# Patient Record
Sex: Female | Born: 1960 | Race: Black or African American | Hispanic: No | Marital: Married | Smoking: Former smoker
Health system: Southern US, Community
[De-identification: ages and names within clinical notes are randomized; demographics above are authoritative.]

## PROBLEM LIST (undated history)

## (undated) DIAGNOSIS — E7800 Pure hypercholesterolemia, unspecified: Secondary | ICD-10-CM

## (undated) DIAGNOSIS — E119 Type 2 diabetes mellitus without complications: Secondary | ICD-10-CM

## (undated) DIAGNOSIS — F419 Anxiety disorder, unspecified: Secondary | ICD-10-CM

## (undated) DIAGNOSIS — M549 Dorsalgia, unspecified: Secondary | ICD-10-CM

## (undated) DIAGNOSIS — D573 Sickle-cell trait: Secondary | ICD-10-CM

## (undated) DIAGNOSIS — G4730 Sleep apnea, unspecified: Secondary | ICD-10-CM

## (undated) DIAGNOSIS — F329 Major depressive disorder, single episode, unspecified: Secondary | ICD-10-CM

## (undated) DIAGNOSIS — D649 Anemia, unspecified: Secondary | ICD-10-CM

## (undated) DIAGNOSIS — F32A Depression, unspecified: Secondary | ICD-10-CM

## (undated) HISTORY — DX: Anemia, unspecified: D64.9

## (undated) HISTORY — PX: TUBAL LIGATION: SHX77

## (undated) HISTORY — DX: Anxiety disorder, unspecified: F41.9

## (undated) HISTORY — DX: Pure hypercholesterolemia, unspecified: E78.00

## (undated) HISTORY — DX: Sleep apnea, unspecified: G47.30

---

## 2000-08-13 ENCOUNTER — Emergency Department (HOSPITAL_COMMUNITY): Admission: EM | Admit: 2000-08-13 | Discharge: 2000-08-13 | Payer: Self-pay | Admitting: Emergency Medicine

## 2001-08-20 ENCOUNTER — Emergency Department (HOSPITAL_COMMUNITY): Admission: EM | Admit: 2001-08-20 | Discharge: 2001-08-20 | Payer: Self-pay | Admitting: *Deleted

## 2001-11-10 ENCOUNTER — Other Ambulatory Visit: Admission: RE | Admit: 2001-11-10 | Discharge: 2001-11-10 | Payer: Self-pay | Admitting: Family Medicine

## 2002-07-28 ENCOUNTER — Emergency Department (HOSPITAL_COMMUNITY): Admission: EM | Admit: 2002-07-28 | Discharge: 2002-07-28 | Payer: Self-pay | Admitting: Emergency Medicine

## 2002-10-25 ENCOUNTER — Encounter: Payer: Self-pay | Admitting: Emergency Medicine

## 2002-10-25 ENCOUNTER — Emergency Department (HOSPITAL_COMMUNITY): Admission: EM | Admit: 2002-10-25 | Discharge: 2002-10-25 | Payer: Self-pay | Admitting: Emergency Medicine

## 2002-12-24 ENCOUNTER — Emergency Department (HOSPITAL_COMMUNITY): Admission: EM | Admit: 2002-12-24 | Discharge: 2002-12-24 | Payer: Self-pay | Admitting: Emergency Medicine

## 2004-06-05 ENCOUNTER — Emergency Department (HOSPITAL_COMMUNITY): Admission: EM | Admit: 2004-06-05 | Discharge: 2004-06-05 | Payer: Self-pay | Admitting: Emergency Medicine

## 2004-11-01 ENCOUNTER — Emergency Department (HOSPITAL_COMMUNITY): Admission: EM | Admit: 2004-11-01 | Discharge: 2004-11-02 | Payer: Self-pay | Admitting: Emergency Medicine

## 2005-10-22 ENCOUNTER — Emergency Department (HOSPITAL_COMMUNITY): Admission: EM | Admit: 2005-10-22 | Discharge: 2005-10-22 | Payer: Self-pay | Admitting: Emergency Medicine

## 2006-01-10 ENCOUNTER — Inpatient Hospital Stay (HOSPITAL_COMMUNITY): Admission: AD | Admit: 2006-01-10 | Discharge: 2006-01-10 | Payer: Self-pay | Admitting: Obstetrics and Gynecology

## 2006-04-17 ENCOUNTER — Emergency Department (HOSPITAL_COMMUNITY): Admission: EM | Admit: 2006-04-17 | Discharge: 2006-04-17 | Payer: Self-pay | Admitting: Emergency Medicine

## 2006-04-20 ENCOUNTER — Emergency Department (HOSPITAL_COMMUNITY): Admission: EM | Admit: 2006-04-20 | Discharge: 2006-04-20 | Payer: Self-pay | Admitting: Emergency Medicine

## 2006-12-15 ENCOUNTER — Emergency Department (HOSPITAL_COMMUNITY): Admission: EM | Admit: 2006-12-15 | Discharge: 2006-12-15 | Payer: Self-pay | Admitting: Emergency Medicine

## 2007-12-19 ENCOUNTER — Emergency Department (HOSPITAL_COMMUNITY): Admission: EM | Admit: 2007-12-19 | Discharge: 2007-12-19 | Payer: Self-pay | Admitting: Emergency Medicine

## 2008-05-14 ENCOUNTER — Emergency Department (HOSPITAL_COMMUNITY): Admission: EM | Admit: 2008-05-14 | Discharge: 2008-05-14 | Payer: Self-pay | Admitting: Emergency Medicine

## 2008-06-29 ENCOUNTER — Emergency Department (HOSPITAL_COMMUNITY): Admission: EM | Admit: 2008-06-29 | Discharge: 2008-06-29 | Payer: Self-pay | Admitting: Emergency Medicine

## 2009-05-12 ENCOUNTER — Emergency Department (HOSPITAL_COMMUNITY): Admission: EM | Admit: 2009-05-12 | Discharge: 2009-05-12 | Payer: Self-pay | Admitting: Emergency Medicine

## 2009-08-05 ENCOUNTER — Emergency Department (HOSPITAL_COMMUNITY)
Admission: EM | Admit: 2009-08-05 | Discharge: 2009-08-06 | Payer: Self-pay | Source: Home / Self Care | Admitting: Emergency Medicine

## 2010-02-16 ENCOUNTER — Emergency Department (HOSPITAL_COMMUNITY)
Admission: EM | Admit: 2010-02-16 | Discharge: 2010-02-16 | Disposition: A | Discharge status: Home / Self Care | Payer: Self-pay | Attending: Emergency Medicine | Admitting: Emergency Medicine

## 2010-02-16 DIAGNOSIS — M545 Low back pain, unspecified: Secondary | ICD-10-CM | POA: Insufficient documentation

## 2010-02-16 DIAGNOSIS — IMO0001 Reserved for inherently not codable concepts without codable children: Secondary | ICD-10-CM | POA: Insufficient documentation

## 2010-04-22 LAB — URINALYSIS, ROUTINE W REFLEX MICROSCOPIC
Glucose, UA: NEGATIVE mg/dL
Leukocytes, UA: NEGATIVE
Protein, ur: NEGATIVE mg/dL
Specific Gravity, Urine: 1.016 (ref 1.005–1.030)
pH: 5 (ref 5.0–8.0)

## 2010-04-22 LAB — BASIC METABOLIC PANEL
BUN: 11 mg/dL (ref 6–23)
Chloride: 106 mEq/L (ref 96–112)
GFR calc non Af Amer: >60 mL/min (ref >60)
Glucose, Bld: 88 mg/dL (ref 70–99)
Potassium: 3.6 mEq/L (ref 3.5–5.1)
Sodium: 140 mEq/L (ref 135–145)

## 2010-04-22 LAB — CBC
HCT: 40.9 % (ref 36.0–46.0)
Hemoglobin: 14.2 g/dL (ref 12.0–15.0)
MCV: 90.8 fL (ref 78.0–100.0)
Platelets: 247 10*3/uL (ref 150–400)
WBC: 6 10*3/uL (ref 4.0–10.5)

## 2010-04-22 LAB — DIFFERENTIAL
Eosinophils Absolute: 0.1 10*3/uL (ref 0.0–0.7)
Eosinophils Relative: 2 % (ref 0–5)
Lymphocytes Relative: 22 % (ref 12–46)
Lymphs Abs: 1.3 10*3/uL (ref 0.7–4.0)
Monocytes Absolute: 0.4 10*3/uL (ref 0.1–1.0)
Monocytes Relative: 7 % (ref 3–12)

## 2010-04-22 LAB — URINE MICROSCOPIC-ADD ON

## 2010-04-22 LAB — POCT CARDIAC MARKERS: Troponin i, poc: <0.05 ng/mL (ref 0.00–0.09)

## 2010-04-22 LAB — POCT PREGNANCY, URINE: Preg Test, Ur: NEGATIVE

## 2010-04-23 LAB — POCT CARDIAC MARKERS
CKMB, poc: <1 ng/mL — ABNORMAL LOW (ref 1.0–8.0)
Troponin i, poc: <0.05 ng/mL (ref 0.00–0.09)

## 2010-08-17 ENCOUNTER — Emergency Department (HOSPITAL_COMMUNITY)
Admission: EM | Admit: 2010-08-17 | Discharge: 2010-08-17 | Disposition: A | Discharge status: Home / Self Care | Payer: Self-pay | Attending: Emergency Medicine | Admitting: Emergency Medicine

## 2010-08-17 ENCOUNTER — Emergency Department (HOSPITAL_COMMUNITY): Payer: Self-pay

## 2010-08-17 DIAGNOSIS — M549 Dorsalgia, unspecified: Secondary | ICD-10-CM | POA: Insufficient documentation

## 2010-08-17 DIAGNOSIS — M79609 Pain in unspecified limb: Secondary | ICD-10-CM | POA: Insufficient documentation

## 2010-08-23 ENCOUNTER — Emergency Department (HOSPITAL_COMMUNITY)
Admission: EM | Admit: 2010-08-23 | Discharge: 2010-08-23 | Disposition: A | Discharge status: Home / Self Care | Payer: Self-pay | Attending: Emergency Medicine | Admitting: Emergency Medicine

## 2010-08-23 DIAGNOSIS — M67919 Unspecified disorder of synovium and tendon, unspecified shoulder: Secondary | ICD-10-CM | POA: Insufficient documentation

## 2010-08-23 DIAGNOSIS — M436 Torticollis: Secondary | ICD-10-CM | POA: Insufficient documentation

## 2010-08-23 DIAGNOSIS — M542 Cervicalgia: Secondary | ICD-10-CM | POA: Insufficient documentation

## 2010-08-23 DIAGNOSIS — M25519 Pain in unspecified shoulder: Secondary | ICD-10-CM | POA: Insufficient documentation

## 2010-08-23 DIAGNOSIS — M719 Bursopathy, unspecified: Secondary | ICD-10-CM | POA: Insufficient documentation

## 2010-09-02 ENCOUNTER — Emergency Department (HOSPITAL_COMMUNITY)
Admission: EM | Admit: 2010-09-02 | Discharge: 2010-09-02 | Disposition: A | Discharge status: Home / Self Care | Payer: Self-pay | Attending: Emergency Medicine | Admitting: Emergency Medicine

## 2010-09-02 DIAGNOSIS — M79609 Pain in unspecified limb: Secondary | ICD-10-CM | POA: Insufficient documentation

## 2010-09-02 DIAGNOSIS — M25519 Pain in unspecified shoulder: Secondary | ICD-10-CM | POA: Insufficient documentation

## 2010-10-18 LAB — URINE MICROSCOPIC-ADD ON

## 2010-10-18 LAB — URINALYSIS, ROUTINE W REFLEX MICROSCOPIC
Bilirubin Urine: NEGATIVE
Nitrite: NEGATIVE
Specific Gravity, Urine: 1.016 (ref 1.005–1.030)
Urobilinogen, UA: 0.2 mg/dL (ref 0.0–1.0)

## 2010-10-29 ENCOUNTER — Other Ambulatory Visit: Payer: Self-pay | Admitting: Internal Medicine

## 2010-10-29 ENCOUNTER — Other Ambulatory Visit: Payer: Self-pay | Admitting: Family Medicine

## 2010-10-29 DIAGNOSIS — Z1231 Encounter for screening mammogram for malignant neoplasm of breast: Secondary | ICD-10-CM

## 2010-11-14 ENCOUNTER — Ambulatory Visit
Admission: RE | Admit: 2010-11-14 | Discharge: 2010-11-14 | Disposition: A | Discharge status: Home / Self Care | Payer: Medicaid Other | Source: Ambulatory Visit | Attending: Internal Medicine | Admitting: Internal Medicine

## 2010-11-14 DIAGNOSIS — Z1231 Encounter for screening mammogram for malignant neoplasm of breast: Secondary | ICD-10-CM

## 2011-06-22 ENCOUNTER — Encounter (HOSPITAL_COMMUNITY): Payer: Self-pay

## 2011-06-22 ENCOUNTER — Emergency Department (HOSPITAL_COMMUNITY)
Admission: EM | Admit: 2011-06-22 | Discharge: 2011-06-22 | Disposition: A | Discharge status: Home / Self Care | Payer: Medicaid Other | Attending: Emergency Medicine | Admitting: Emergency Medicine

## 2011-06-22 DIAGNOSIS — Z87891 Personal history of nicotine dependence: Secondary | ICD-10-CM | POA: Insufficient documentation

## 2011-06-22 DIAGNOSIS — IMO0002 Reserved for concepts with insufficient information to code with codable children: Secondary | ICD-10-CM | POA: Insufficient documentation

## 2011-06-22 DIAGNOSIS — M5430 Sciatica, unspecified side: Secondary | ICD-10-CM

## 2011-06-22 HISTORY — DX: Dorsalgia, unspecified: M54.9

## 2011-06-22 MED ORDER — HYDROCODONE-ACETAMINOPHEN 5-325 MG PO TABS: 1.0000 | ORAL_TABLET | Freq: Four times a day (QID) | ORAL | Status: AC | PRN

## 2011-06-22 MED ORDER — KETOROLAC TROMETHAMINE 60 MG/2ML IM SOLN
60.0000 mg | Freq: Once | INTRAMUSCULAR | Status: AC
  Administered 2011-06-22: 60 mg via INTRAMUSCULAR
  Filled 2011-06-22: qty 2

## 2011-06-22 MED ORDER — CYCLOBENZAPRINE HCL 10 MG PO TABS: 10.0000 mg | ORAL_TABLET | Freq: Three times a day (TID) | ORAL | Status: AC | PRN

## 2011-06-22 MED ORDER — PREDNISONE 50 MG PO TABS: 50.0000 mg | ORAL_TABLET | Freq: Every day | ORAL | Status: AC

## 2011-06-22 NOTE — Discharge Instructions (Signed)
Followup with her primary care Dr. for recheck.  Use ice and heat on the lower back.  Return here for any worsening in her condition

## 2011-06-22 NOTE — ED Provider Notes (Signed)
Medical screening examination/treatment/procedure(s) were performed by non-physician practitioner and as supervising physician I was immediately available for consultation/collaboration.   Dominique Calvey, MD 06/22/11 2027 

## 2011-06-22 NOTE — ED Provider Notes (Signed)
History     CSN: [id number]  Arrival date & time 06/22/11  1529   First MD Initiated Contact with Patient 06/22/11 1729      Chief Complaint  Patient presents with  . Back Pain  . leg cramping     (Consider location/radiation/quality/duration/timing/severity/associated sxs/prior treatment) HPI The patient presents to the emergency department with back pain that began yesterday.  The patient has had chronic low back pain in the past.  She states that the pain radiates from her left lower back into her left leg.  Patient, states she tried over-the-counter medications without relief.  Patient, states that she has not had any numbness, weakness, fever, nausea, vomiting, abdominal pain, chest pain, shortness of breath, or dizziness. Past Medical History  Diagnosis Date  . Back pain     History reviewed. No pertinent past surgical history.  No family history on file.  History  Substance Use Topics  . Smoking status: Former Games developer  . Smokeless tobacco: Not on file  . Alcohol Use: No    OB History    Grav Para Term Preterm Abortions TAB SAB Ect Mult Living                  Review of Systems All other systems negative except as documented in the HPI. All pertinent positives and negatives as reviewed in the HPI.  Allergies  Review of patient's allergies indicates no known allergies.  Home Medications   Current Outpatient Rx  Name Route Sig Dispense Refill  . DIPHENHYDRAMINE-APAP (SLEEP) 25-500 MG PO TABS Oral Take 2 tablets by mouth at bedtime as needed. For sleep.      BP 114/73  Pulse 74  Temp(Src) 98.7 F (37.1 C) (Oral)  Resp 18  SpO2 100%  LMP 06/15/2011  Physical Exam  Nursing note and vitals reviewed. Constitutional: She is oriented to person, place, and time. She appears well-developed and well-nourished. She appears distressed.  HENT:  Head: Normocephalic and atraumatic.  Eyes: Pupils are equal, round, and reactive to light.  Cardiovascular: Normal  rate and regular rhythm.  Exam reveals no gallop and no friction rub.   No murmur heard. Pulmonary/Chest: Effort normal and breath sounds normal. No respiratory distress.  Musculoskeletal:       Lumbar back: She exhibits tenderness and pain. She exhibits no bony tenderness, no swelling and no deformity.       Back:  Neurological: She is alert and oriented to person, place, and time. She has normal strength. No sensory deficit. She displays a negative Romberg sign. Coordination and gait normal.  Reflex Scores:      Patellar reflexes are 2+ on the right side and 2+ on the left side.      Achilles reflexes are 2+ on the right side and 2+ on the left side. Skin: Skin is warm and dry. No rash noted.    ED Course  Procedures (including critical care time)  Patient be treated for lumbar radiculopathy based on her history of present illness and physical exam findings.  Patient is advised to follow with her primary care Dr. for recheck.  The patient, states she has been seeing her primary doctor for this problem in the past.  Patient does not have any neurological deficits and normal reflexes on exam, along with normal gait.   MDM          Carlyle Dolly, PA-C 06/22/11 [phone]

## 2011-06-22 NOTE — ED Notes (Signed)
Pt in from home c/o back pain since yesterday non radiating states a hx of chronic back pain states no elief with overt the counter medicines pt also states left leg cramping on and off pt denies recent injury

## 2011-10-10 ENCOUNTER — Encounter (HOSPITAL_COMMUNITY): Payer: Self-pay | Admitting: *Deleted

## 2011-10-10 ENCOUNTER — Inpatient Hospital Stay (HOSPITAL_COMMUNITY)
Admission: AD | Admit: 2011-10-10 | Discharge: 2011-10-10 | Disposition: A | Discharge status: Home / Self Care | Payer: Medicaid Other | Source: Ambulatory Visit | Attending: Obstetrics & Gynecology | Admitting: Obstetrics & Gynecology

## 2011-10-10 DIAGNOSIS — L739 Follicular disorder, unspecified: Secondary | ICD-10-CM

## 2011-10-10 DIAGNOSIS — L738 Other specified follicular disorders: Secondary | ICD-10-CM

## 2011-10-10 DIAGNOSIS — N949 Unspecified condition associated with female genital organs and menstrual cycle: Secondary | ICD-10-CM | POA: Insufficient documentation

## 2011-10-10 HISTORY — DX: Depression, unspecified: F32.A

## 2011-10-10 HISTORY — DX: Sickle-cell trait: D57.3

## 2011-10-10 HISTORY — DX: Major depressive disorder, single episode, unspecified: F32.9

## 2011-10-10 MED ORDER — BACITRACIN 500 UNIT/GM EX OINT: 1.0000 "application " | TOPICAL_OINTMENT | Freq: Two times a day (BID) | CUTANEOUS | Status: DC

## 2011-10-10 NOTE — MAU Note (Signed)
Patient states she had a boil like bump on the left labia that she squeezed. Has gotten bigger and is more painful.

## 2011-10-10 NOTE — MAU Provider Note (Signed)
  History     CSN: [id number]  Arrival date and time: 10/10/11 1553   None     Chief Complaint  Patient presents with  . Vaginal Pain   HPI This is a 51 y.o. female who presents with c/o area of infection on her left labia. Has a history of Bartholins but does not remember it. This area is smaller. Thinks it is hair bumps. Not too painful, but irritated. Not sexually active in past 5 yrs.  OB History    Grav Para Term Preterm Abortions TAB SAB Ect Mult Living   11 5 5  6 6    5       Past Medical History  Diagnosis Date  . Back pain   . Sickle cell trait   . Depression     Past Surgical History  Procedure Date  . Cesarean section   . Tubal ligation     History reviewed. No pertinent family history.  History  Substance Use Topics  . Smoking status: Former Games developer  . Smokeless tobacco: Not on file  . Alcohol Use: Yes     occasional beer    Allergies: No Known Allergies  Prescriptions prior to admission  Medication Sig Dispense Refill  . diphenhydrAMINE (BENADRYL) 25 mg capsule Take 25 mg by mouth every 6 (six) hours as needed. For allergies        ROS See HPI  Physical Exam   Blood pressure 124/85, pulse 76, temperature 98.6 F (37 C), temperature source Oral, resp. rate 16, height 5\' 3"  (1.6 m), weight 209 lb 9.6 oz (95.074 kg), last menstrual period 08/28/2011, SpO2 100.00%.  Physical Exam  Constitutional: She is oriented to person, place, and time. She appears well-developed and well-nourished. No distress.  Cardiovascular: Normal rate.   Respiratory: Effort normal.  GI: Soft.  Genitourinary: Vagina normal and uterus normal. No vaginal discharge found.       There is a small 1cm oval area on left labial edge with several small white bumps.  Little erethema. Overall appearance could represent an HSV lesion, though it is not ulcerated. Could also be folliculitis  Musculoskeletal: Normal range of motion.  Neurological: She is alert and oriented to  person, place, and time.  Skin: Skin is warm and dry.  Psychiatric: She has a normal mood and affect.    MAU Course  Procedures  Assessment and Plan  A:  Folliculitis      Small chance this could represent HSV lesion  P:  Discussed/offered HSV culture, pt declines. Does not think it is HSV      Rx Bactroban ointment       Keep clean, warm soaks, ointment at night.      Followup as needed  [facility] 10/10/2011, 5:16 PM

## 2011-12-20 ENCOUNTER — Encounter (HOSPITAL_COMMUNITY): Payer: Self-pay | Admitting: *Deleted

## 2011-12-20 ENCOUNTER — Emergency Department (HOSPITAL_COMMUNITY)
Admission: EM | Admit: 2011-12-20 | Discharge: 2011-12-20 | Disposition: A | Discharge status: Home / Self Care | Payer: Self-pay | Attending: Emergency Medicine | Admitting: Emergency Medicine

## 2011-12-20 DIAGNOSIS — Z87891 Personal history of nicotine dependence: Secondary | ICD-10-CM | POA: Insufficient documentation

## 2011-12-20 DIAGNOSIS — R600 Localized edema: Secondary | ICD-10-CM

## 2011-12-20 DIAGNOSIS — D573 Sickle-cell trait: Secondary | ICD-10-CM | POA: Insufficient documentation

## 2011-12-20 DIAGNOSIS — A64 Unspecified sexually transmitted disease: Secondary | ICD-10-CM | POA: Insufficient documentation

## 2011-12-20 DIAGNOSIS — R609 Edema, unspecified: Secondary | ICD-10-CM | POA: Insufficient documentation

## 2011-12-20 DIAGNOSIS — A599 Trichomoniasis, unspecified: Secondary | ICD-10-CM | POA: Insufficient documentation

## 2011-12-20 DIAGNOSIS — Z8659 Personal history of other mental and behavioral disorders: Secondary | ICD-10-CM | POA: Insufficient documentation

## 2011-12-20 LAB — URINALYSIS, ROUTINE W REFLEX MICROSCOPIC
Bilirubin Urine: NEGATIVE
Glucose, UA: NEGATIVE mg/dL
Nitrite: NEGATIVE
Specific Gravity, Urine: 1.018 (ref 1.005–1.030)
pH: 5.5 (ref 5.0–8.0)

## 2011-12-20 LAB — BASIC METABOLIC PANEL
BUN: 11 mg/dL (ref 6–23)
Calcium: 8.6 mg/dL (ref 8.4–10.5)
Creatinine, Ser: 0.59 mg/dL (ref 0.50–1.10)
GFR calc Af Amer: >90 mL/min (ref >90)
GFR calc non Af Amer: >90 mL/min (ref >90)

## 2011-12-20 LAB — CBC
MCHC: 34.7 g/dL (ref 30.0–36.0)
RDW: 13 % (ref 11.5–15.5)

## 2011-12-20 LAB — PRO B NATRIURETIC PEPTIDE: Pro B Natriuretic peptide (BNP): 26.6 pg/mL (ref 0–125)

## 2011-12-20 LAB — URINE MICROSCOPIC-ADD ON

## 2011-12-20 MED ORDER — METRONIDAZOLE 500 MG PO TABS
2000.0000 mg | ORAL_TABLET | Freq: Once | ORAL | Status: AC
  Administered 2011-12-20: 2000 mg via ORAL
  Filled 2011-12-20: qty 4

## 2011-12-20 NOTE — ED Provider Notes (Signed)
History     CSN: [id number]  Arrival date & time 12/20/11  1758   First MD Initiated Contact with Patient 12/20/11 2005      Chief Complaint  Patient presents with  . Leg Swelling   HPI  History provided by the patient. Patient is a 51 year old Latin American female with no significant PMH who presents with complaints of bilateral feet and ankle swelling. Patient reports noticing some swelling yesterday but more so today. Symptoms seem to worsen at the end of the evening and day. Patient does report standing most of the day for her job. She denies having any fever ankle swelling previously. Swelling is not painful. She denies any skin change or color change. Denies any injuries or traumas. She denies any chest pain, shortness of breath or heart palpitations. Patient denies any decreased urine output, dysuria, urinary frequency or hematuria. Patient reports some increase on healthy eating recently but no other changes.    Past Medical History  Diagnosis Date  . Back pain   . Sickle cell trait   . Depression     Past Surgical History  Procedure Date  . Cesarean section   . Tubal ligation     No family history on file.  History  Substance Use Topics  . Smoking status: Former Games developer  . Smokeless tobacco: Not on file  . Alcohol Use: Yes     Comment: occasional beer    OB History    Grav Para Term Preterm Abortions TAB SAB Ect Mult Living   11 5 5  6 6    5       Review of Systems  Constitutional: Negative for fever, chills, diaphoresis and appetite change.  Respiratory: Negative for shortness of breath.   Cardiovascular: Positive for leg swelling. Negative for chest pain and palpitations.  Gastrointestinal: Negative for nausea, vomiting, abdominal pain and diarrhea.  Genitourinary: Negative for dysuria, frequency, hematuria, flank pain, decreased urine volume, vaginal bleeding and vaginal discharge.  Neurological: Negative for weakness and numbness.  All other systems  reviewed and are negative.    Allergies  Review of patient's allergies indicates no known allergies.  Home Medications   Current Outpatient Rx  Name  Route  Sig  Dispense  Refill  . IBUPROFEN 200 MG PO TABS   Oral   Take 200 mg by mouth every 6 (six) hours as needed. Pain           BP 109/69  Pulse 74  Temp 97.8 F (36.6 C) (Oral)  Resp 16  Ht 5\' 4"  (1.626 m)  Wt 200 lb (90.719 kg)  BMI 34.33 kg/m2  SpO2 99%  LMP 12/16/2011  Physical Exam  Nursing note and vitals reviewed. Constitutional: She is oriented to person, place, and time. She appears well-developed and well-nourished. No distress.  HENT:  Head: Normocephalic.  Cardiovascular: Normal rate and regular rhythm.   No murmur heard. Pulmonary/Chest: Effort normal and breath sounds normal. No respiratory distress. She has no wheezes. She has no rales.  Abdominal: Soft. There is no tenderness. There is no rebound and no guarding.       Obese  Musculoskeletal: Normal range of motion. She exhibits edema. She exhibits no tenderness.       Mild bilateral lower feet and ankle swelling. No significant pitting. Normal dorsal pedal pulses. Normal movements. Normal sensation and cap refill in toes.  Neurological: She is alert and oriented to person, place, and time.  Skin: Skin is warm and dry. No  rash noted. No erythema.  Psychiatric: She has a normal mood and affect. Her behavior is normal.    ED Course  Procedures   Results for orders placed during the hospital encounter of 12/20/11  PRO B NATRIURETIC PEPTIDE      Component Value Range   Pro B Natriuretic peptide (BNP) 26.6  0 - 125 pg/mL  URINALYSIS, ROUTINE W REFLEX MICROSCOPIC      Component Value Range   Color, Urine YELLOW  YELLOW   APPearance CLOUDY (*) CLEAR   Specific Gravity, Urine 1.018  1.005 - 1.030   pH 5.5  5.0 - 8.0   Glucose, UA NEGATIVE  NEGATIVE mg/dL   Hgb urine dipstick LARGE (*) NEGATIVE   Bilirubin Urine NEGATIVE  NEGATIVE   Ketones, ur  NEGATIVE  NEGATIVE mg/dL   Protein, ur NEGATIVE  NEGATIVE mg/dL   Urobilinogen, UA 0.2  0.0 - 1.0 mg/dL   Nitrite NEGATIVE  NEGATIVE   Leukocytes, UA SMALL (*) NEGATIVE  URINE MICROSCOPIC-ADD ON      Component Value Range   Squamous Epithelial / LPF FEW (*) RARE   WBC, UA 3-6  <3 WBC/hpf   RBC / HPF 3-6  <3 RBC/hpf   Bacteria, UA FEW (*) RARE   Crystals URIC ACID CRYSTALS (*) NEGATIVE   Urine-Other TRICHOMONAS PRESENT    BASIC METABOLIC PANEL      Component Value Range   Sodium 142  135 - 145 mEq/L   Potassium 3.5  3.5 - 5.1 mEq/L   Chloride 106  96 - 112 mEq/L   CO2 23  19 - 32 mEq/L   Glucose, Bld 133 (*) 70 - 99 mg/dL   BUN 11  6 - 23 mg/dL   Creatinine, Ser 1.61  0.50 - 1.10 mg/dL   Calcium 8.6  8.4 - 09.6 mg/dL   GFR calc non Af Amer >90  >90 mL/min   GFR calc Af Amer >90  >90 mL/min  CBC      Component Value Range   WBC 4.4  4.0 - 10.5 K/uL   RBC 3.92  3.87 - 5.11 MIL/uL   Hemoglobin 11.6 (*) 12.0 - 15.0 g/dL   HCT 04.5 (*) 40.9 - 81.1 %   MCV 85.2  78.0 - 100.0 fL   MCH 29.6  26.0 - 34.0 pg   MCHC 34.7  30.0 - 36.0 g/dL   RDW 91.4  78.2 - 95.6 %   Platelets 240  150 - 400 K/uL       1. Bilateral edema of lower extremity   2. Trichomonas   3. STD (female)       MDM  8:05 PM patient seen and evaluated. Patient is well-appearing in no acute distress.  Patient have Trichomonas and urine sample. Denies any urinary or vaginal complaints. States her last echocardiogram was over a month ago. At this time will give treatment with Flagyl. Patient encouraged followup with the [facility] STD clinic and advised also to partners of diagnosis.        Angus Seller, [place] 12/21/11 [phone]

## 2011-12-20 NOTE — ED Notes (Signed)
Pt from home, alert and oriented, ambulatory in triage. sts that today she noticed bilateral ankle swelling that she has never experienced before. Denies taking any new medication or hx of CHF. Pt sts no pain unless her shoes are on. Pt denies difficulty breathing or shob.

## 2011-12-21 NOTE — ED Provider Notes (Signed)
Medical screening examination/treatment/procedure(s) were performed by non-physician practitioner and as supervising physician I was immediately available for consultation/collaboration.   Marnee Sherrard, MD 12/21/11 2307 

## 2011-12-22 LAB — URINE CULTURE: Colony Count: >=100000

## 2012-08-16 ENCOUNTER — Emergency Department: Payer: Self-pay | Admitting: Emergency Medicine

## 2012-11-10 ENCOUNTER — Emergency Department: Payer: Self-pay | Admitting: Internal Medicine

## 2012-11-25 ENCOUNTER — Emergency Department: Payer: Self-pay | Admitting: Emergency Medicine

## 2012-11-26 ENCOUNTER — Ambulatory Visit (INDEPENDENT_AMBULATORY_CARE_PROVIDER_SITE_OTHER): Payer: No Typology Code available for payment source

## 2012-11-26 ENCOUNTER — Ambulatory Visit (INDEPENDENT_AMBULATORY_CARE_PROVIDER_SITE_OTHER): Payer: No Typology Code available for payment source | Admitting: Podiatry

## 2012-11-26 ENCOUNTER — Encounter: Payer: Self-pay | Admitting: Podiatry

## 2012-11-26 VITALS — BP 108/61 | HR 68 | Resp 16 | Ht 64.0 in | Wt 230.0 lb

## 2012-11-26 DIAGNOSIS — M722 Plantar fascial fibromatosis: Secondary | ICD-10-CM

## 2012-11-26 DIAGNOSIS — M2010 Hallux valgus (acquired), unspecified foot: Secondary | ICD-10-CM

## 2012-11-26 DIAGNOSIS — M2040 Other hammer toe(s) (acquired), unspecified foot: Secondary | ICD-10-CM

## 2012-11-26 MED ORDER — TRIAMCINOLONE ACETONIDE 10 MG/ML IJ SUSP
5.0000 mg | Freq: Once | INTRAMUSCULAR | Status: AC
  Administered 2012-11-26: 5 mg via INTRA_ARTICULAR

## 2012-11-26 NOTE — Progress Notes (Signed)
Subjective:     Patient ID: Gabrielle Williams, female   DOB: 1960/11/22, 52 y.o.   MRN: [id number]  Foot Pain   patient presents stating my heels have been hurting me for about a month and gotten worse over that period of time. States that they're awful in the morning and after periods of sitting. States the right one has been hurting worse than the left and she has tried shoe modifications and taking ibuprofen and went to the emergency room yesterday and was placed on tramadol  Review of Systems  All other systems reviewed and are negative.       Objective:   Physical Exam  Nursing note and vitals reviewed. Constitutional: She is oriented to person, place, and time.  Cardiovascular: Intact distal pulses.   Musculoskeletal: Normal range of motion.  Neurological: She is oriented to person, place, and time.  Skin: Skin is warm.   patient has exquisite discomfort in the plantar heel region right over left with inflammation noted at the insertion into the calcaneus. Also noted to have mild structural bunion deformity right over left with elevation of the lesser digits right over left foot. Neurovascular status intact with no health history issues noted    Assessment:     Acute plantar fasciitis right over left. 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 Injected the plantar fascia bilateral 3 mg Kenalog 5 mg Xylocaine Marcaine mixture and applied fascially brace right. Advised on continuing tramadol 1 or 2 per day until I see her back next week

## 2012-11-26 NOTE — Progress Notes (Signed)
  Subjective:    Patient ID: Gabrielle Williams, female    DOB: Feb 28, 1960, 52 y.o.   MRN: [id number]  HPI Comments: N burning , sharp stinging feelings, throbbing L bilateral plantar heel  D 3 weeks or more  O all of sudden  C worse  A mornings are the worse  T icing , ibuprofen   Foot Pain      Review of Systems  Constitutional:       Weight gain   Cardiovascular:       Pain in calf when walking   Gastrointestinal: Positive for blood in stool.  Musculoskeletal:       Muscle and joint pain       Objective:   Physical Exam        Assessment & Plan:

## 2012-11-26 NOTE — Patient Instructions (Signed)
Bunion You have a bunion deformity of the feet. This is more common in women. It tends to be an inherited problem. Symptoms can include pain, swelling, and deformity around the great toe. Numbness and tingling may also be present. Your symptoms are often worsened by wearing shoes that cause pressure on the bunion. Changing the type of shoes you wear helps reduce symptoms. A wide shoe decreases pressure on the bunion. An arch support may be used if you have flat feet. Avoid shoes with heels higher than two inches. This puts more pressure on the bunion. X-rays may be helpful in evaluating the severity of the problem. Other foot problems often seen with bunions include corns, calluses, and hammer toes. If the deformity or pain is severe, surgical treatment may be necessary. Keep off your painful foot as much as possible until the pain is relieved. Call your caregiver if your symptoms are worse.  SEEK IMMEDIATE MEDICAL CARE IF:  You have increased redness, pain, swelling, or other symptoms of infection. Document Released: 12/30/2004 Document Revised: 03/24/2011 Document Reviewed: 06/29/2006 [facility] Patient Information 2014 [place], [place]. Plantar Fasciitis (Heel Spur Syndrome) with Rehab The plantar fascia is a fibrous, ligament-like, soft-tissue structure that spans the bottom of the foot. Plantar fasciitis is a condition that causes pain in the foot due to inflammation of the tissue. SYMPTOMS   Pain and tenderness on the underneath side of the foot.  Pain that worsens with standing or walking. CAUSES  Plantar fasciitis is caused by irritation and injury to the plantar fascia on the underneath side of the foot. Common mechanisms of injury include:  Direct trauma to bottom of the foot.  Damage to a small nerve that runs under the foot where the main fascia attaches to the heel bone.  Stress placed on the plantar fascia due to bone spurs. RISK INCREASES WITH:   Activities that place stress on  the plantar fascia (running, jumping, pivoting, or cutting).  Poor strength and flexibility.  Improperly fitted shoes.  Tight calf muscles.  Flat feet.  Failure to warm-up properly before activity.  Obesity. PREVENTION  Warm up and stretch properly before activity.  Allow for adequate recovery between workouts.  Maintain physical fitness:  Strength, flexibility, and endurance.  Cardiovascular fitness.  Maintain a health body weight.  Avoid stress on the plantar fascia.  Wear properly fitted shoes, including arch supports for individuals who have flat feet. PROGNOSIS  If treated properly, then the symptoms of plantar fasciitis usually resolve without surgery. However, occasionally surgery is necessary. RELATED COMPLICATIONS   Recurrent symptoms that may result in a chronic condition.  Problems of the lower back that are caused by compensating for the injury, such as limping.  Pain or weakness of the foot during push-off following surgery.  Chronic inflammation, scarring, and partial or complete fascia tear, occurring more often from repeated injections. TREATMENT  Treatment initially involves the use of ice and medication to help reduce pain and inflammation. The use of strengthening and stretching exercises may help reduce pain with activity, especially stretches of the Achilles tendon. These exercises may be performed at home or with a therapist. Your caregiver may recommend that you use heel cups of arch supports to help reduce stress on the plantar fascia. Occasionally, corticosteroid injections are given to reduce inflammation. If symptoms persist for greater than 6 months despite non-surgical (conservative), then surgery may be recommended.  MEDICATION   If pain medication is necessary, then nonsteroidal anti-inflammatory medications, such as aspirin and ibuprofen,  or other minor pain relievers, such as acetaminophen, are often recommended.  Do not take pain  medication within 7 days before surgery.  Prescription pain relievers may be given if deemed necessary by your caregiver. Use only as directed and only as much as you need.  Corticosteroid injections may be given by your caregiver. These injections should be reserved for the most serious cases, because they may only be given a certain number of times. HEAT AND COLD  Cold treatment (icing) relieves pain and reduces inflammation. Cold treatment should be applied for 10 to 15 minutes every 2 to 3 hours for inflammation and pain and immediately after any activity that aggravates your symptoms. Use ice packs or massage the area with a piece of ice (ice massage).  Heat treatment may be used prior to performing the stretching and strengthening activities prescribed by your caregiver, physical therapist, or athletic trainer. Use a heat pack or soak the injury in warm water. SEEK IMMEDIATE MEDICAL CARE IF:  Treatment seems to offer no benefit, or the condition worsens.  Any medications produce adverse side effects. EXERCISES RANGE OF MOTION (ROM) AND STRETCHING EXERCISES - Plantar Fasciitis (Heel Spur Syndrome) These exercises may help you when beginning to rehabilitate your injury. Your symptoms may resolve with or without further involvement from your physician, physical therapist or athletic trainer. While completing these exercises, remember:   Restoring tissue flexibility helps normal motion to return to the joints. This allows healthier, less painful movement and activity.  An effective stretch should be held for at least 30 seconds.  A stretch should never be painful. You should only feel a gentle lengthening or release in the stretched tissue. RANGE OF MOTION - Toe Extension, Flexion  Sit with your right / left leg crossed over your opposite knee.  Grasp your toes and gently pull them back toward the top of your foot. You should feel a stretch on the bottom of your toes and/or  foot.  Hold this stretch for __________ seconds.  Now, gently pull your toes toward the bottom of your foot. You should feel a stretch on the top of your toes and or foot.  Hold this stretch for __________ seconds. Repeat __________ times. Complete this stretch __________ times per day.  RANGE OF MOTION - Ankle Dorsiflexion, Active Assisted  Remove shoes and sit on a chair that is preferably not on a carpeted surface.  Place right / left foot under knee. Extend your opposite leg for support.  Keeping your heel down, slide your right / left foot back toward the chair until you feel a stretch at your ankle or calf. If you do not feel a stretch, slide your bottom forward to the edge of the chair, while still keeping your heel down.  Hold this stretch for __________ seconds. Repeat __________ times. Complete this stretch __________ times per day.  STRETCH  Gastroc, Standing  Place hands on wall.  Extend right / left leg, keeping the front knee somewhat bent.  Slightly point your toes inward on your back foot.  Keeping your right / left heel on the floor and your knee straight, shift your weight toward the wall, not allowing your back to arch.  You should feel a gentle stretch in the right / left calf. Hold this position for __________ seconds. Repeat __________ times. Complete this stretch __________ times per day. STRETCH  Soleus, Standing  Place hands on wall.  Extend right / left leg, keeping the other knee somewhat bent.  Slightly point your toes inward on your back foot.  Keep your right / left heel on the floor, bend your back knee, and slightly shift your weight over the back leg so that you feel a gentle stretch deep in your back calf.  Hold this position for __________ seconds. Repeat __________ times. Complete this stretch __________ times per day. STRETCH  Gastrocsoleus, Standing  Note: This exercise can place a lot of stress on your foot and ankle. Please complete  this exercise only if specifically instructed by your caregiver.   Place the ball of your right / left foot on a step, keeping your other foot firmly on the same step.  Hold on to the wall or a rail for balance.  Slowly lift your other foot, allowing your body weight to press your heel down over the edge of the step.  You should feel a stretch in your right / left calf.  Hold this position for __________ seconds.  Repeat this exercise with a slight bend in your right / left knee. Repeat __________ times. Complete this stretch __________ times per day.  STRENGTHENING EXERCISES - Plantar Fasciitis (Heel Spur Syndrome)  These exercises may help you when beginning to rehabilitate your injury. They may resolve your symptoms with or without further involvement from your physician, physical therapist or athletic trainer. While completing these exercises, remember:   Muscles can gain both the endurance and the strength needed for everyday activities through controlled exercises.  Complete these exercises as instructed by your physician, physical therapist or athletic trainer. Progress the resistance and repetitions only as guided. STRENGTH - Towel Curls  Sit in a chair positioned on a non-carpeted surface.  Place your foot on a towel, keeping your heel on the floor.  Pull the towel toward your heel by only curling your toes. Keep your heel on the floor.  If instructed by your physician, physical therapist or athletic trainer, add ____________________ at the end of the towel. Repeat __________ times. Complete this exercise __________ times per day. STRENGTH - Ankle Inversion  Secure one end of a rubber exercise band/tubing to a fixed object (table, pole). Loop the other end around your foot just before your toes.  Place your fists between your knees. This will focus your strengthening at your ankle.  Slowly, pull your big toe up and in, making sure the band/tubing is positioned to resist  the entire motion.  Hold this position for __________ seconds.  Have your muscles resist the band/tubing as it slowly pulls your foot back to the starting position. Repeat __________ times. Complete this exercises __________ times per day.  Document Released: 12/30/2004 Document Revised: 03/24/2011 Document Reviewed: 04/13/2008 [facility] Patient Information 2014 [place], [place]. Plantar Fasciitis Plantar fasciitis is a common condition that causes foot pain. It is soreness (inflammation) of the band of tough fibrous tissue on the bottom of the foot that runs from the heel bone (calcaneus) to the ball of the foot. The cause of this soreness may be from excessive standing, poor fitting shoes, running on hard surfaces, being overweight, having an abnormal walk, or overuse (this is common in runners) of the painful foot or feet. It is also common in aerobic exercise dancers and ballet dancers. SYMPTOMS  Most people with plantar fasciitis complain of:  Severe pain in the morning on the bottom of their foot especially when taking the first steps out of bed. This pain recedes after a few minutes of walking.  Severe pain is experienced also during  walking following a long period of inactivity.  Pain is worse when walking barefoot or up stairs DIAGNOSIS   Your caregiver will diagnose this condition by examining and feeling your foot.  Special tests such as X-rays of your foot, are usually not needed. PREVENTION   Consult a sports medicine professional before beginning a new exercise program.  Walking programs offer a good workout. With walking there is a lower chance of overuse injuries common to runners. There is less impact and less jarring of the joints.  Begin all new exercise programs slowly. If problems or pain develop, decrease the amount of time or distance until you are at a comfortable level.  Wear good shoes and replace them regularly.  Stretch your foot and the heel cords at the  back of the ankle (Achilles tendon) both before and after exercise.  Run or exercise on even surfaces that are not hard. For example, asphalt is better than pavement.  Do not run barefoot on hard surfaces.  If using a treadmill, vary the incline.  Do not continue to workout if you have foot or joint problems. Seek professional help if they do not improve. HOME CARE INSTRUCTIONS   Avoid activities that cause you pain until you recover.  Use ice or cold packs on the problem or painful areas after working out.  Only take over-the-counter or prescription medicines for pain, discomfort, or fever as directed by your caregiver.  Soft shoe inserts or athletic shoes with air or gel sole cushions may be helpful.  If problems continue or become more severe, consult a sports medicine caregiver or your own health care provider. Cortisone is a potent anti-inflammatory medication that may be injected into the painful area. You can discuss this treatment with your caregiver. MAKE SURE YOU:   Understand these instructions.  Will watch your condition.  Will get help right away if you are not doing well or get worse. Document Released: 09/24/2000 Document Revised: 03/24/2011 Document Reviewed: 11/24/2007 [facility] Patient Information 2014 [place], [place].

## 2012-12-03 ENCOUNTER — Encounter: Payer: Self-pay | Admitting: Podiatry

## 2012-12-03 ENCOUNTER — Ambulatory Visit (INDEPENDENT_AMBULATORY_CARE_PROVIDER_SITE_OTHER): Payer: No Typology Code available for payment source | Admitting: Podiatry

## 2012-12-03 VITALS — BP 114/72 | HR 67 | Resp 16 | Ht 64.0 in | Wt 230.0 lb

## 2012-12-03 DIAGNOSIS — M722 Plantar fascial fibromatosis: Secondary | ICD-10-CM

## 2012-12-03 MED ORDER — TRIAMCINOLONE ACETONIDE 10 MG/ML IJ SUSP
5.0000 mg | Freq: Once | INTRAMUSCULAR | Status: AC
  Administered 2012-12-03: 5 mg via INTRA_ARTICULAR

## 2012-12-03 MED ORDER — TRAMADOL HCL 50 MG PO TABS: 50.0000 mg | ORAL_TABLET | Freq: Two times a day (BID) | ORAL | Status: DC

## 2012-12-03 NOTE — Progress Notes (Signed)
Subjective:     Patient ID: Gabrielle Williams, female   DOB: 04-23-1960, 52 y.o.   MRN: [id number]  HPI patient states the right heel is still hurting in the left heel is quite a bit better. States it hurts the more that she is on it or after periods of sitting   Review of Systems     Objective:   Physical Exam Neurovascular status intact with moderate discomfort to pressure plantar fascia right    Assessment:     Plantar fasciitis continuing right heel    Plan:     Advised on physical therapy and reinjected the plantar fascia 3 mg Kenalog 5 mg Xylocaine Marcaine mixture and dispensed night splint with all instructions on usage along with to ice packs. Reappoint in 3 weeks to recheck and also wrote a note a prescription for tramadol to take twice a day as she has run out of it and it was helpful for her

## 2012-12-31 ENCOUNTER — Ambulatory Visit: Payer: No Typology Code available for payment source | Admitting: Podiatry

## 2013-02-05 ENCOUNTER — Emergency Department: Payer: Self-pay | Admitting: Internal Medicine

## 2013-03-25 ENCOUNTER — Ambulatory Visit (INDEPENDENT_AMBULATORY_CARE_PROVIDER_SITE_OTHER): Payer: No Typology Code available for payment source | Admitting: Podiatry

## 2013-03-25 ENCOUNTER — Encounter: Payer: Self-pay | Admitting: Podiatry

## 2013-03-25 VITALS — BP 115/86 | HR 75 | Resp 16

## 2013-03-25 DIAGNOSIS — M722 Plantar fascial fibromatosis: Secondary | ICD-10-CM

## 2013-03-25 MED ORDER — TRIAMCINOLONE ACETONIDE 10 MG/ML IJ SUSP
10.0000 mg | Freq: Once | INTRAMUSCULAR | Status: AC
  Administered 2013-03-25: 10 mg

## 2013-03-25 MED ORDER — TRAMADOL HCL 50 MG PO TABS: 50.0000 mg | ORAL_TABLET | Freq: Three times a day (TID) | ORAL | Status: DC

## 2013-03-25 NOTE — Progress Notes (Signed)
Subjective:     Patient ID: Gabrielle Williams, female   DOB: Sep 19, 1960, 53 y.o.   MRN: [id number]  HPI patient states that my heel pain on my right foot has returned and it has been very sore for the last 3 or 4 weeks. No change in activity level or health history   Review of Systems     Objective:   Physical Exam Neurovascular status intact with patient well oriented x3. I noted intense discomfort at the insertional point of the fascia right over left foot with inflammation noted of both heels and structural deformity of the arch with collapse of the medial longitudinal arch noted both feet    Assessment:     Plantar fasciitis bilateral with mechanical dysfunction    Plan:     Discuss continuation of night splint and physical therapy and injected the plantar fascia of both feet 3 mg Kenalog 5 mg Xylocaine Marcaine mixture and scanned for custom orthotic devices. Place back on tramadol 50 mg 3 times a day

## 2013-03-25 NOTE — Progress Notes (Signed)
They are both still hurting

## 2013-04-07 ENCOUNTER — Encounter: Payer: Self-pay | Admitting: *Deleted

## 2013-04-07 NOTE — Progress Notes (Signed)
Sent pt post card letting her know orthotics are here. 

## 2013-04-08 ENCOUNTER — Ambulatory Visit (INDEPENDENT_AMBULATORY_CARE_PROVIDER_SITE_OTHER): Payer: No Typology Code available for payment source | Admitting: Podiatry

## 2013-04-08 DIAGNOSIS — M722 Plantar fascial fibromatosis: Secondary | ICD-10-CM

## 2013-04-08 NOTE — Patient Instructions (Signed)

## 2013-04-08 NOTE — Progress Notes (Signed)
Pt presents for orthotic pick up , written and verbal instructions given 

## 2013-04-18 ENCOUNTER — Encounter: Payer: Self-pay | Admitting: Podiatry

## 2013-05-06 ENCOUNTER — Ambulatory Visit: Payer: No Typology Code available for payment source | Admitting: Podiatry

## 2013-05-20 ENCOUNTER — Ambulatory Visit: Payer: No Typology Code available for payment source | Admitting: Podiatry

## 2013-07-06 ENCOUNTER — Emergency Department: Payer: Self-pay | Admitting: Emergency Medicine

## 2013-09-15 ENCOUNTER — Emergency Department: Payer: Self-pay | Admitting: Emergency Medicine

## 2013-11-14 ENCOUNTER — Encounter: Payer: Self-pay | Admitting: Podiatry

## 2013-12-16 ENCOUNTER — Emergency Department: Payer: Self-pay | Admitting: Emergency Medicine

## 2014-04-25 ENCOUNTER — Emergency Department
Admit: 2014-04-25 | Disposition: A | Discharge status: Home / Self Care | Payer: Self-pay | Admitting: Emergency Medicine

## 2014-07-20 ENCOUNTER — Other Ambulatory Visit: Payer: Self-pay

## 2014-07-20 ENCOUNTER — Encounter: Payer: Self-pay | Admitting: *Deleted

## 2014-07-20 ENCOUNTER — Emergency Department
Admission: EM | Admit: 2014-07-20 | Discharge: 2014-07-20 | Disposition: A | Discharge status: Home / Self Care | Payer: BLUE CROSS/BLUE SHIELD | Attending: Emergency Medicine | Admitting: Emergency Medicine

## 2014-07-20 ENCOUNTER — Emergency Department: Payer: BLUE CROSS/BLUE SHIELD

## 2014-07-20 DIAGNOSIS — Z79899 Other long term (current) drug therapy: Secondary | ICD-10-CM | POA: Diagnosis not present

## 2014-07-20 DIAGNOSIS — Z87891 Personal history of nicotine dependence: Secondary | ICD-10-CM | POA: Insufficient documentation

## 2014-07-20 DIAGNOSIS — R42 Dizziness and giddiness: Secondary | ICD-10-CM | POA: Diagnosis present

## 2014-07-20 LAB — URINALYSIS COMPLETE WITH MICROSCOPIC (ARMC ONLY)
Bilirubin Urine: NEGATIVE
Glucose, UA: NEGATIVE mg/dL
Hgb urine dipstick: NEGATIVE
KETONES UR: NEGATIVE mg/dL
NITRITE: NEGATIVE
PH: 6 (ref 5.0–8.0)
PROTEIN: NEGATIVE mg/dL
Specific Gravity, Urine: 1.013 (ref 1.005–1.030)

## 2014-07-20 LAB — BASIC METABOLIC PANEL
Anion gap: 7 (ref 5–15)
BUN: 10 mg/dL (ref 6–20)
CALCIUM: 9 mg/dL (ref 8.9–10.3)
CO2: 30 mmol/L (ref 22–32)
Chloride: 104 mmol/L (ref 101–111)
Creatinine, Ser: 0.76 mg/dL (ref 0.44–1.00)
GFR calc Af Amer: >60 mL/min (ref >60)
GLUCOSE: 95 mg/dL (ref 65–99)
Potassium: 3.8 mmol/L (ref 3.5–5.1)
Sodium: 141 mmol/L (ref 135–145)

## 2014-07-20 LAB — CBC
HEMATOCRIT: 36.1 % (ref 35.0–47.0)
Hemoglobin: 12.1 g/dL (ref 12.0–16.0)
MCH: 27.4 pg (ref 26.0–34.0)
MCHC: 33.4 g/dL (ref 32.0–36.0)
MCV: 82.3 fL (ref 80.0–100.0)
PLATELETS: 242 10*3/uL (ref 150–440)
RBC: 4.39 MIL/uL (ref 3.80–5.20)
RDW: 13.8 % (ref 11.5–14.5)
WBC: 6.2 10*3/uL (ref 3.6–11.0)

## 2014-07-20 MED ORDER — MECLIZINE HCL 12.5 MG PO TABS: 12.5000 mg | ORAL_TABLET | Freq: Three times a day (TID) | ORAL | Status: DC | PRN

## 2014-07-20 NOTE — ED Provider Notes (Signed)
[facility] Emergency Department Provider Note  ____________________________________________  Time seen: Approximately 7:06 PM  I have reviewed the triage vital signs and the nursing notes.   HISTORY  Chief Complaint Dizziness    HPI Gabrielle Williams is a 54 y.o. female comes to the ER for evaluation of episodes of dizziness. Patient reports that she notices them often while working, and over the last several months has had some occasional slight headaches as well as episodes where she feels as if "the floor is moving" where she will get dizzy have to sit down and rest and the symptoms will resolve. She denies being in pain or having symptoms at the present time, but reports her symptoms are slowly worsening and becoming more frequent over the last several months. The last usually less than 30 seconds, and are described as a dizzy feeling, sometimes spinning. She does not feel as though she is going to pass out. Has not had any associated chest pain or shortness of breath. No numbness or tingling, trouble speaking, weakness in the arm or leg.   Past Medical History  Diagnosis Date  . Back pain   . Sickle cell trait   . Depression     There are no active problems to display for this patient.   Past Surgical History  Procedure Laterality Date  . Cesarean section    . Tubal ligation      Current Outpatient Rx  Name  Route  Sig  Dispense  Refill  . ibuprofen (ADVIL,MOTRIN) 200 MG tablet   Oral   Take 200 mg by mouth every 6 (six) hours as needed. Pain         . meclizine (ANTIVERT) 12.5 MG tablet   Oral   Take 1 tablet (12.5 mg total) by mouth 3 (three) times daily as needed for dizziness or nausea.   30 tablet   1   . traMADol (ULTRAM) 50 MG tablet   Oral   Take 50 mg by mouth every 6 (six) hours as needed. Two every four hours         . traMADol (ULTRAM) 50 MG tablet   Oral   Take 1 tablet (50 mg total) by mouth 2 (two) times daily.   60  tablet   2   . traMADol (ULTRAM) 50 MG tablet   Oral   Take 1 tablet (50 mg total) by mouth 3 (three) times daily.   90 tablet   2     Allergies Review of patient's allergies indicates no known allergies.  No family history on file.  Social History History  Substance Use Topics  . Smoking status: Former Games developer  . Smokeless tobacco: Never Used  . Alcohol Use: Yes     Comment: occasional beer    Review of Systems Constitutional: No fever/chills Eyes: No visual changes. ENT: No sore throat. Cardiovascular: Denies chest pain. Respiratory: Denies shortness of breath. Gastrointestinal: No abdominal pain.  No nausea, no vomiting.  No diarrhea.  No constipation. Genitourinary: Negative for dysuria. Musculoskeletal: Negative for back pain. Skin: Negative for rash. Neurological: Negative for focal weakness or numbness. She describes occasional throbbing headaches across the front of her head that seemed to be "stress" related.  10-point ROS otherwise negative.  ____________________________________________   PHYSICAL EXAM:  VITAL SIGNS: ED Triage Vitals  Enc Vitals Group     BP 07/20/14 1819 133/69 mmHg     Pulse Rate 07/20/14 1819 76     Resp 07/20/14  1819 20     Temp 07/20/14 1819 98.6 F (37 C)     Temp Source 07/20/14 1819 Oral     SpO2 07/20/14 1819 100 %     Weight 07/20/14 1819 228 lb (103.42 kg)     Height 07/20/14 1819  (1.626 m)     Head Cir --      Peak Flow --      Pain Score 07/20/14 1827 0     Pain Loc --      Pain Edu? --      Excl. in GC? --     Constitutional: Alert and oriented. Well appearing and in no acute distress. Eyes: Conjunctivae are normal. PERRL. EOMI. Head: Atraumatic. Nose: No congestion/rhinnorhea. Mouth/Throat: Mucous membranes are moist.  Oropharynx non-erythematous. Neck: No stridor.   Cardiovascular: Normal rate, regular rhythm. Grossly normal heart sounds.  Good peripheral circulation. Respiratory: Normal respiratory  effort.  No retractions. Lungs CTAB. Gastrointestinal: Soft and nontender. No distention. No abdominal bruits. No CVA tenderness. Musculoskeletal: No lower extremity tenderness nor edema.  No joint effusions. Neurologic:  Normal speech and language. No gross focal neurologic deficits are appreciated. Speech is normal. No gait instability. Radial nerves are normal. No pronator drift. Normal motor and sensory exam in all extremities. Extraocular movements are normal. The patient's examination is very reassuring, with normal neurologic exam at this time. Skin:  Skin is warm, dry and intact. No rash noted. Psychiatric: Mood and affect are normal. Speech and behavior are normal.  ____________________________________________   LABS (all labs ordered are listed, but only abnormal results are displayed)  Labs Reviewed  URINALYSIS COMPLETEWITH MICROSCOPIC ([facility] ONLY) - Abnormal; Notable for the following:    Color, Urine YELLOW (*)    APPearance CLEAR (*)    Leukocytes, UA 3+ (*)    Bacteria, UA RARE (*)    Squamous Epithelial / LPF 0-5 (*)    All other components within normal limits  URINE CULTURE  BASIC METABOLIC PANEL  CBC   ____________________________________________  EKG  ED ECG REPORT I, Jamison Soward, the attending physician, personally viewed and interpreted this ECG.  Date: 07/20/2014 EKG Time: 1830 Rate: 75 Rhythm: normal sinus rhythm QRS Axis: normal Intervals: normal ST/T Wave abnormalities: normal Conduction Disutrbances: none Narrative Interpretation: unremarkable  ____________________________________________  RADIOLOGY  CLINICAL DATA: Intermittent dizziness for the past 6 months, more frequent recently.  EXAM: CT HEAD WITHOUT CONTRAST  TECHNIQUE: Contiguous axial images were obtained from the base of the skull through the vertex without intravenous contrast.  COMPARISON: None.  FINDINGS: Normal appearing cerebral hemispheres and posterior  fossa structures. Normal size and position of the ventricles. No intracranial hemorrhage, mass lesion or CT evidence of acute infarction. Unremarkable bones and included paranasal sinuses.  IMPRESSION: Normal examination.  I ordered a CT head as the patient reports that her symptoms seemed to be getting worse, and she is occasionally having headaches. She denies ever having had a previous CAT scan or MRI for similar symptoms. I want to assess to assure that we do not see any obvious cause for any sort of central vertigo type symptoms. ____________________________________________   PROCEDURES  Procedure(s) performed: None  Critical Care performed: No  ____________________________________________   INITIAL IMPRESSION / ASSESSMENT AND PLAN / ED COURSE  Pertinent labs & imaging results that were available during my care of the patient were reviewed by me and considered in my medical decision making (see chart for details).  Patient presents with episodes of what  sound to be vertigo, versus possible orthostatic weakness or lightheadedness. She denies any acute cardiopulmonary symptoms. Her neurologic exam is very normal and intact. Her basic labs are reassuring and normal. I did discuss the patient that we could trial Antivert, and I did encourage her to follow with a primary care doctor. She currently has insurance but no PCP, but is agreeable to calling her primary ensure to find primary's in our area.  I did obtain a CT head for her to rule out central cause.  EKG is normal.  Patient's urinalysis does demonstrate some white blood cells, leukocytes esterase, and rare bacteria. She is asymptomatic, and based upon not having any urinary symptoms including no fever, dysuria, clouding, or abnormal odor of the urine have added on a urine culture. I hesitate to treat her empirically as she does not have any acute urinary tract type symptoms. We will treat her if her urine culture is  positive. ____________________________________________   FINAL CLINICAL IMPRESSION(S) / ED DIAGNOSES  Final diagnoses:  Vertigo  episodic light-headedness    Sharyn CreamerMark Froylan Hobby, MD 07/20/14 2005

## 2014-07-20 NOTE — Discharge Instructions (Signed)
Dizziness  Please do not drive while taking meclizine as it may make you dizzy. Please follow-up with [facility] clinic to establish a primary care physician. Return to the emergency room right away should you develop a severe headache, fever, chest pain, numbness or tingling in an arm or leg, weakness in arm or leg, droop in your face, or other new concerns arise.  Dizziness is a common problem. It is a feeling of unsteadiness or light-headedness. You may feel like you are about to faint. Dizziness can lead to injury if you stumble or fall. A person of any age group can suffer from dizziness, but dizziness is more common in older adults. CAUSES  Dizziness can be caused by many different things, including:  Middle ear problems.  Standing for too long.  Infections.  An allergic reaction.  Aging.  An emotional response to something, such as the sight of blood.  Side effects of medicines.  Tiredness.  Problems with circulation or blood pressure.  Excessive use of alcohol or medicines, or illegal drug use.  Breathing too fast (hyperventilation).  An irregular heart rhythm (arrhythmia).  A low red blood cell count (anemia).  Pregnancy.  Vomiting, diarrhea, fever, or other illnesses that cause body fluid loss (dehydration).  Diseases or conditions such as Parkinson's disease, high blood pressure (hypertension), diabetes, and thyroid problems.  Exposure to extreme heat. DIAGNOSIS  Your health care provider will ask about your symptoms, perform a physical exam, and perform an electrocardiogram (ECG) to record the electrical activity of your heart. Your health care provider may also perform other heart or blood tests to determine the cause of your dizziness. These may include:  Transthoracic echocardiogram (TTE). During echocardiography, sound waves are used to evaluate how blood flows through your heart.  Transesophageal echocardiogram (TEE).  Cardiac monitoring. This allows  your health care provider to monitor your heart rate and rhythm in real time.  Holter monitor. This is a portable device that records your heartbeat and can help diagnose heart arrhythmias. It allows your health care provider to track your heart activity for several days if needed.  Stress tests by exercise or by giving medicine that makes the heart beat faster. TREATMENT  Treatment of dizziness depends on the cause of your symptoms and can vary greatly. HOME CARE INSTRUCTIONS   Drink enough fluids to keep your urine clear or pale yellow. This is especially important in very hot weather. In older adults, it is also important in cold weather.  Take your medicine exactly as directed if your dizziness is caused by medicines. When taking blood pressure medicines, it is especially important to get up slowly.  Rise slowly from chairs and steady yourself until you feel okay.  In the morning, first sit up on the side of the bed. When you feel okay, stand slowly while holding onto something until you know your balance is fine.  Move your legs often if you need to stand in one place for a long time. Tighten and relax your muscles in your legs while standing.  Have someone stay with you for 1-2 days if dizziness continues to be a problem. Do this until you feel you are well enough to stay alone. Have the person call your health care provider if he or she notices changes in you that are concerning.  Do not drive or use heavy machinery if you feel dizzy.  Do not drink alcohol. SEEK IMMEDIATE MEDICAL CARE IF:   Your dizziness or light-headedness gets worse.  You feel nauseous or vomit.  You have problems talking, walking, or using your arms, hands, or legs.  You feel weak.  You are not thinking clearly or you have trouble forming sentences. It may take a friend or family member to notice this.  You have chest pain, abdominal pain, shortness of breath, or sweating.  Your vision changes.  You  notice any bleeding.  You have side effects from medicine that seems to be getting worse rather than better. MAKE SURE YOU:   Understand these instructions.  Will watch your condition.  Will get help right away if you are not doing well or get worse. Document Released: 06/25/2000 Document Revised: 01/04/2013 Document Reviewed: 07/19/2010 [facility] Patient Information 2015 [employer] [place]. This information is not intended to replace advice given to you by your health care provider. Make sure you discuss any questions you have with your health care provider.

## 2014-07-20 NOTE — ED Notes (Signed)
AAOx3.  Skin warm and dry.  Moving all extremities equally and strong. Ambulates with easy and steady gait.   

## 2014-07-20 NOTE — ED Notes (Signed)
Pt reports dizzy spells. Sx for months. States dizzy spells more frequent now.  No n//v/d.  Pt also reports frequent h/a's.  Pt alert.  Speech is clear.  No chest pain or sob.

## 2014-07-22 LAB — URINE CULTURE: Special Requests: NORMAL

## 2015-06-19 DIAGNOSIS — E669 Obesity, unspecified: Secondary | ICD-10-CM | POA: Insufficient documentation

## 2015-07-02 ENCOUNTER — Ambulatory Visit: Payer: Self-pay

## 2015-07-09 ENCOUNTER — Ambulatory Visit
Admission: RE | Admit: 2015-07-09 | Discharge: 2015-07-09 | Disposition: A | Discharge status: Home / Self Care | Payer: Self-pay | Source: Ambulatory Visit | Attending: Internal Medicine | Admitting: Internal Medicine

## 2015-07-09 ENCOUNTER — Ambulatory Visit: Payer: Self-pay | Attending: Internal Medicine

## 2015-07-09 ENCOUNTER — Encounter: Payer: Self-pay | Admitting: *Deleted

## 2015-07-09 VITALS — BP 115/64 | HR 58 | Temp 98.4°F | Resp 18 | Ht 64.57 in | Wt 223.8 lb

## 2015-07-09 DIAGNOSIS — Z0000 Encounter for general adult medical examination without abnormal findings: Secondary | ICD-10-CM

## 2015-07-09 NOTE — Progress Notes (Signed)
Subjective:     Patient ID: Gabrielle Williams, female   DOB: 1960/01/24, 55 y.o.   MRN: [id number]  HPI   Review of Systems     Objective:   Physical Exam  Pulmonary/Chest: Right breast exhibits no inverted nipple, no mass, no nipple discharge, no skin change and no tenderness. Left breast exhibits no inverted nipple, no mass, no nipple discharge, no skin change and no tenderness. Breasts are asymmetrical.  right breast smaller than left.  Tattoo above left breast.       Assessment:     55 [facility].  Patient screened, and meets BCCCP eligibility.  Patient does not have insurance, Medicare or Medicaid.  Handout given on Affordable Care Act.  Instructed patient on breast self-exam using teach back method.  CBE unremarkable.  No mass or lump palpated.  Patient ticklish on exam.  Works at [employer].      Plan:     Sent for bilateral screening mammogram.

## 2015-07-23 NOTE — Progress Notes (Signed)
Letter mailed from [facility] to notify of normal mammogram results.  Patient to return in one year for annual screening.  Copy to [facility]. 

## 2016-05-06 ENCOUNTER — Encounter: Payer: Self-pay | Admitting: Medical Oncology

## 2016-05-06 ENCOUNTER — Emergency Department: Payer: BLUE CROSS/BLUE SHIELD

## 2016-05-06 ENCOUNTER — Emergency Department
Admission: EM | Admit: 2016-05-06 | Discharge: 2016-05-06 | Disposition: A | Discharge status: Home / Self Care | Payer: BLUE CROSS/BLUE SHIELD | Attending: Emergency Medicine | Admitting: Emergency Medicine

## 2016-05-06 DIAGNOSIS — M2011 Hallux valgus (acquired), right foot: Secondary | ICD-10-CM | POA: Insufficient documentation

## 2016-05-06 DIAGNOSIS — M79671 Pain in right foot: Secondary | ICD-10-CM | POA: Diagnosis present

## 2016-05-06 DIAGNOSIS — M19071 Primary osteoarthritis, right ankle and foot: Secondary | ICD-10-CM | POA: Insufficient documentation

## 2016-05-06 MED ORDER — DICLOFENAC SODIUM 50 MG PO TBEC: 50.0000 mg | DELAYED_RELEASE_TABLET | Freq: Two times a day (BID) | ORAL | 1 refills | Status: DC

## 2016-05-06 MED ORDER — DICLOFENAC SODIUM 75 MG PO TBEC
75.0000 mg | DELAYED_RELEASE_TABLET | Freq: Once | ORAL | Status: AC
  Administered 2016-05-06: 75 mg via ORAL
  Filled 2016-05-06: qty 1

## 2016-05-06 NOTE — ED Notes (Signed)
See triage note  States she developed pain to top of right foot couple of days ago..staes pain started on the bottom and now moved to top of foot  No deformity noted

## 2016-05-06 NOTE — ED Triage Notes (Signed)
Pt reports pain to rt foot for a couple of days, no injury, pain worsened this am.

## 2016-05-06 NOTE — ED Provider Notes (Signed)
[facility] Emergency Department Provider Note ____________________________________________  Time seen: 1010  I have reviewed the triage vital signs and the nursing notes.  HISTORY  Chief Complaint  Foot Pain  HPI Gabrielle Williams is a 56 y.o. female presents to the ED for evaluation of right foot pain x 2 days. She denies injury, accident, or trauma. She reports a lot of walking at her job. The company provides work shoes. She has a history of prior foot tendinitis, treated by Dr. Charlsie Merles with injections back in 2015. She reports she has been on this job since 2017. She denies any interim complaints. She has dosed Tylenol today without relief.   Past Medical History:  Diagnosis Date  . Back pain   . Depression   . Sickle cell trait (HCC)     There are no active problems to display for this patient.   Past Surgical History:  Procedure Laterality Date  . CESAREAN SECTION    . TUBAL LIGATION      Prior to Admission medications   Medication Sig Start Date End Date Taking? Authorizing Provider  diclofenac (VOLTAREN) 50 MG EC tablet Take 1 tablet (50 mg total) by mouth 2 (two) times daily. 05/06/16   Oluwatimileyin Vivier V Bacon Emidio Warrell, PA-C  ibuprofen (ADVIL,MOTRIN) 200 MG tablet Take 200 mg by mouth every 6 (six) hours as needed. Pain    Historical Provider, MD  meclizine (ANTIVERT) 12.5 MG tablet Take 1 tablet (12.5 mg total) by mouth 3 (three) times daily as needed for dizziness or nausea. 07/20/14   Sharyn Creamer, MD  traMADol (ULTRAM) 50 MG tablet Take 50 mg by mouth every 6 (six) hours as needed. Two every four hours    Historical Provider, MD  traMADol (ULTRAM) 50 MG tablet Take 1 tablet (50 mg total) by mouth 2 (two) times daily. 12/03/12   Lenn Sink, DPM  traMADol (ULTRAM) 50 MG tablet Take 1 tablet (50 mg total) by mouth 3 (three) times daily. 03/25/13   Lenn Sink, DPM    Allergies Patient has no known allergies.  Family History  Problem Relation Age of  Onset  . Breast cancer Mother 48  . Colon cancer Father   . Lung cancer Brother     Social History Social History  Substance Use Topics  . Smoking status: Not on file  . Smokeless tobacco: Not on file  . Alcohol use Yes     Comment: occasional beer    Review of Systems  Constitutional: Negative for fever. Musculoskeletal: Negative for back pain. Right foot pain as above.  Skin: Negative for rash. Neurological: Negative for headaches, focal weakness or numbness. ____________________________________________  PHYSICAL EXAM:  VITAL SIGNS: ED Triage Vitals  Enc Vitals Group     BP 05/06/16 0949 125/64     Pulse Rate 05/06/16 0949 67     Resp 05/06/16 0949 20     Temp 05/06/16 0949 97.6 F (36.4 C)     Temp Source 05/06/16 0949 Oral     SpO2 05/06/16 0949 98 %     Weight 05/06/16 0948 222 lb (100.7 kg)     Height 05/06/16 0948  (1.626 m)     Head Circumference --      Peak Flow --      Pain Score 05/06/16 0948 10     Pain Loc --      Pain Edu? --      Excl. in [place]? --  Constitutional: Alert and oriented. Well appearing and in no distress. Head: Normocephalic and atraumatic. Cardiovascular: Normal rate, regular rhythm. Normal distal pulses. Respiratory: Normal respiratory effort.  Musculoskeletal: Right foot without obvious edema, erythema, or acute injury. Bunion deformity to the 1st MTP noted. Normal ankle exam. No calf or achilles tenderness. Nontender with normal range of motion in all extremities.  Neurologic:  Normal gross sensation. Normal speech and language. No gross focal neurologic deficits are appreciated. Skin:  Skin is warm, dry and intact. No rash noted. ____________________________________________  PROCEDURES  Voltaren 75 mg PO ____________________________________________  INITIAL IMPRESSION / ASSESSMENT AND PLAN / ED COURSE  Patient right foot pain without recent injury. She has a history of foot tendinitis, previously treated by podiatry.  She will be discharged with a prescription for Voltaren and a referral back to Dr. Charlsie Merles for further evaluation. She is provided a work note for 2 days as requested.  ____________________________________________  FINAL CLINICAL IMPRESSION(S) / ED DIAGNOSES  Final diagnoses:  Foot pain, right  Hallux valgus (acquired), right foot  Primary osteoarthritis of right foot     Lissa Hoard, PA-C 05/06/16 1121    Governor Rooks, MD 05/06/16 1419

## 2016-05-06 NOTE — Discharge Instructions (Signed)
Your foot exam and x-ray are normal today. You will be placed on a daily anti-inflammatory pain medicine for arthritis. Don't take any additional OTC anti-inflammatories (ibuprofen, Motrin, Aleve, Advil, naproxen), while on the prescription med. You may take OTC Tylenol (acetaminophen) if needed.  You should follow-up with Dr. Charlsie Merles for re-evaluation, since you have started a new job since last being treated by him.

## 2017-07-08 DIAGNOSIS — F32A Depression, unspecified: Secondary | ICD-10-CM | POA: Insufficient documentation

## 2018-12-15 ENCOUNTER — Emergency Department: Payer: Worker's Compensation

## 2018-12-15 ENCOUNTER — Other Ambulatory Visit: Payer: Self-pay

## 2018-12-15 ENCOUNTER — Emergency Department
Admission: EM | Admit: 2018-12-15 | Discharge: 2018-12-15 | Disposition: A | Discharge status: Home / Self Care | Payer: Worker's Compensation | Attending: Emergency Medicine | Admitting: Emergency Medicine

## 2018-12-15 DIAGNOSIS — S76301A Unspecified injury of muscle, fascia and tendon of the posterior muscle group at thigh level, right thigh, initial encounter: Secondary | ICD-10-CM

## 2018-12-15 DIAGNOSIS — Y92512 Supermarket, store or market as the place of occurrence of the external cause: Secondary | ICD-10-CM | POA: Insufficient documentation

## 2018-12-15 DIAGNOSIS — W010XXA Fall on same level from slipping, tripping and stumbling without subsequent striking against object, initial encounter: Secondary | ICD-10-CM | POA: Diagnosis not present

## 2018-12-15 DIAGNOSIS — Y990 Civilian activity done for income or pay: Secondary | ICD-10-CM | POA: Insufficient documentation

## 2018-12-15 DIAGNOSIS — Y9389 Activity, other specified: Secondary | ICD-10-CM | POA: Insufficient documentation

## 2018-12-15 DIAGNOSIS — S79921A Unspecified injury of right thigh, initial encounter: Secondary | ICD-10-CM | POA: Diagnosis present

## 2018-12-15 MED ORDER — OXYCODONE-ACETAMINOPHEN 5-325 MG PO TABS
1.0000 | ORAL_TABLET | Freq: Once | ORAL | Status: AC
  Administered 2018-12-15: 12:00:00 1 via ORAL
  Filled 2018-12-15: qty 1

## 2018-12-15 MED ORDER — LIDOCAINE 5 % EX PTCH: 1.0000 | MEDICATED_PATCH | CUTANEOUS | 0 refills | Status: DC

## 2018-12-15 MED ORDER — BACLOFEN 5 MG PO TABS: 5.0000 mg | ORAL_TABLET | Freq: Three times a day (TID) | ORAL | 0 refills | Status: DC | PRN

## 2018-12-15 MED ORDER — ORPHENADRINE CITRATE 30 MG/ML IJ SOLN
60.0000 mg | Freq: Two times a day (BID) | INTRAMUSCULAR | Status: DC
  Administered 2018-12-15: 60 mg via INTRAMUSCULAR
  Filled 2018-12-15: qty 2

## 2018-12-15 MED ORDER — LIDOCAINE 5 % EX PTCH
1.0000 | MEDICATED_PATCH | CUTANEOUS | Status: DC
  Administered 2018-12-15: 14:00:00 1 via TRANSDERMAL
  Filled 2018-12-15: qty 1

## 2018-12-15 MED ORDER — KETOROLAC TROMETHAMINE 30 MG/ML IJ SOLN
30.0000 mg | Freq: Once | INTRAMUSCULAR | Status: AC
  Administered 2018-12-15: 14:00:00 30 mg via INTRAMUSCULAR
  Filled 2018-12-15: qty 1

## 2018-12-15 MED ORDER — ACETAMINOPHEN 325 MG PO TABS
650.0000 mg | ORAL_TABLET | Freq: Once | ORAL | Status: AC
  Administered 2018-12-15: 14:00:00 650 mg via ORAL
  Filled 2018-12-15: qty 2

## 2018-12-15 MED ORDER — OXYCODONE-ACETAMINOPHEN 5-325 MG PO TABS: 1.0000 | ORAL_TABLET | Freq: Three times a day (TID) | ORAL | 0 refills | Status: AC | PRN

## 2018-12-15 MED ORDER — IBUPROFEN 600 MG PO TABS: 600.0000 mg | ORAL_TABLET | Freq: Four times a day (QID) | ORAL | 0 refills | Status: AC | PRN

## 2018-12-15 NOTE — ED Triage Notes (Signed)
Pt comes EMS from work at [employer] after a fall. Her right foot went more forward and she did a split onto the floor. Pt c/o right hamstring area pain. No LOC. Did not hit head. Pt was not able to get up after fall. Sharp pain with any manipulation or movement of area.

## 2018-12-15 NOTE — ED Notes (Signed)
Pt able to walk with walker around room. Pt still in pain but able to do this independently.

## 2018-12-15 NOTE — ED Provider Notes (Signed)
[facility] Emergency Department Provider Note  ____________________________________________  Time seen: Approximately 11:37 AM  I have reviewed the triage vital signs and the nursing notes.   HISTORY  Chief Complaint Fall    HPI Gabrielle Williams is a 58 y.o. female that presents to the emergency department for evaluation of right but buttocks and right thigh pain after fall today. Patient fell doing a splits like motion, where her right leg went forward and her left leg went backwards.  Pain is primarily to the back of her right thigh.  She did not hit her head or lose consciousness.  She does not feel that anything is broken.  She has had difficulty walking due to pain since.   Past Medical History:  Diagnosis Date  . Back pain   . Depression   . Sickle cell trait (HCC)     There are no active problems to display for this patient.   Past Surgical History:  Procedure Laterality Date  . CESAREAN SECTION    . TUBAL LIGATION      Prior to Admission medications   Medication Sig Start Date End Date Taking? Authorizing Provider  Baclofen 5 MG TABS Take 5 mg by mouth 3 (three) times daily as needed. 12/15/18   Enid DerryWagner, Nashya Garlington, PA-C  diclofenac (VOLTAREN) 50 MG EC tablet Take 1 tablet (50 mg total) by mouth 2 (two) times daily. 05/06/16   Menshew, Charlesetta IvoryJenise V Bacon, PA-C  ibuprofen (ADVIL) 600 MG tablet Take 1 tablet (600 mg total) by mouth every 6 (six) hours as needed. 12/15/18   Enid DerryWagner, Tyshika Baldridge, PA-C  lidocaine (LIDODERM) 5 % Place 1 patch onto the skin daily. Remove & Discard patch within 12 hours or as directed by MD 12/15/18   Enid DerryWagner, Meilin Brosh, PA-C  meclizine (ANTIVERT) 12.5 MG tablet Take 1 tablet (12.5 mg total) by mouth 3 (three) times daily as needed for dizziness or nausea. 07/20/14   Sharyn CreamerQuale, Mark, MD  oxyCODONE-acetaminophen (PERCOCET) 5-325 MG tablet Take 1 tablet by mouth every 8 (eight) hours as needed for up to 2 days for severe pain. 12/15/18 12/17/18   Enid DerryWagner, Donie Moulton, PA-C  traMADol (ULTRAM) 50 MG tablet Take 50 mg by mouth every 6 (six) hours as needed. Two every four hours    [provider]  traMADol (ULTRAM) 50 MG tablet Take 1 tablet (50 mg total) by mouth 2 (two) times daily. 12/03/12   Lenn Sinkegal, Norman S, DPM  traMADol (ULTRAM) 50 MG tablet Take 1 tablet (50 mg total) by mouth 3 (three) times daily. 03/25/13   Lenn Sinkegal, Norman S, DPM    Allergies Patient has no known allergies.  Family History  Problem Relation Age of Onset  . Breast cancer Mother 178  . Colon cancer Father   . Lung cancer Brother     Social History Social History   Tobacco Use  . Smoking status: Not on file  Substance Use Topics  . Alcohol use: Yes    Comment: occasional beer  . Drug use: No     Review of Systems  Cardiovascular: No chest pain. Respiratory: No SOB. Gastrointestinal: No nausea, no vomiting.  Musculoskeletal: Positive for leg pain. Skin: Negative for rash, abrasions, lacerations, ecchymosis. Neurological: Negative for numbness or tingling   ____________________________________________   PHYSICAL EXAM:  VITAL SIGNS: ED Triage Vitals  Enc Vitals Group     BP 12/15/18 1130 112/82     Pulse Rate 12/15/18 1130 67     Resp 12/15/18 1130 16  Temp --      Temp src --      SpO2 12/15/18 1130 100 %     Weight 12/15/18 1128 218 lb (98.9 kg)     Height 12/15/18 1128 5\' 4"  (1.626 m)     Head Circumference --      Peak Flow --      Pain Score 12/15/18 1128 10     Pain Loc --      Pain Edu? --      Excl. in [place]? --      Constitutional: Alert and oriented. Well appearing and in no acute distress. Eyes: Conjunctivae are normal. PERRL. EOMI. Head: Atraumatic. ENT:      Ears:      Nose: No congestion/rhinnorhea.      Mouth/Throat: Mucous membranes are moist.  Neck: No stridor.  Cardiovascular: Normal rate, regular rhythm.  Good peripheral circulation. Respiratory: Normal respiratory effort without tachypnea or  retractions. Lungs CTAB. Good air entry to the bases with no decreased or absent breath sounds. Gastrointestinal: Bowel sounds 4 quadrants. Soft and nontender to palpation. No guarding or rigidity. No palpable masses. No distention.  Musculoskeletal: Full range of motion to all extremities. No gross deformities appreciated.  No tenderness to palpation to lumbar spine.  Tenderness to palpation to right distal hamstring.  Full range of motion of right hip.  Weightbearing.  Antalgic gait. Neurologic:  Normal speech and language. No gross focal neurologic deficits are appreciated.  Skin:  Skin is warm, dry and intact. No rash noted. Psychiatric: Mood and affect are normal. Speech and behavior are normal. Patient exhibits appropriate insight and judgement.   ____________________________________________   LABS (all labs ordered are listed, but only abnormal results are displayed)  Labs Reviewed - No data to display ____________________________________________  EKG   ____________________________________________  RADIOLOGY Robinette Haines, personally viewed and evaluated these images (plain radiographs) as part of my medical decision making, as well as reviewing the written report by the radiologist.  Dg Femur Min 2 Views Right  Result Date: 12/15/2018 CLINICAL DATA:  Right femur pain caused by doing a split today. EXAM: RIGHT FEMUR 2 VIEWS COMPARISON:  None. FINDINGS: There is no evidence of fracture or other focal bone lesions. Soft tissues are unremarkable. IMPRESSION: Negative. Electronically Signed   By: Lorriane Shire M.D.   On: 12/15/2018 13:18    ____________________________________________    PROCEDURES  Procedure(s) performed:    Procedures    Medications  orphenadrine (NORFLEX) injection 60 mg (60 mg Intramuscular Given 12/15/18 1354)  lidocaine (LIDODERM) 5 % 1 patch (1 patch Transdermal Patch Applied 12/15/18 1359)  oxyCODONE-acetaminophen (PERCOCET/ROXICET) 5-325 MG  per tablet 1 tablet (1 tablet Oral Given 12/15/18 1202)  ketorolac (TORADOL) 30 MG/ML injection 30 mg (30 mg Intramuscular Given 12/15/18 1356)  acetaminophen (TYLENOL) tablet 650 mg (650 mg Oral Given 12/15/18 1358)     ____________________________________________   INITIAL IMPRESSION / ASSESSMENT AND PLAN / ED COURSE  Pertinent labs & imaging results that were available during my care of the patient were reviewed by me and considered in my medical decision making (see chart for details).  Review of the [place] CSRS was performed in accordance of the [place] prior to dispensing any controlled drugs.  Patient presented to emergency department for evaluation of fall.  Vital signs and exam are reassuring.  X-ray negative for acute abnormalities.  Patient likely has a hamstring injury.  Pain significantly improved after medications.  Knee immobilizer was placed and walker  was given.  She is able to ambulate without assistance.  Patient will be discharged home with prescriptions for baclofen, advil, lidoderm and a short course of percocet. Patient is to follow up with orthopedics as directed. Patient is given ED precautions to return to the ED for any worsening or new symptoms.  JARROD BODKINS was evaluated in Emergency Department on 12/15/2018 for the symptoms described in the history of present illness. She was evaluated in the context of the global COVID-19 pandemic, which necessitated consideration that the patient might be at risk for infection with the SARS-CoV-2 virus that causes COVID-19. Institutional protocols and algorithms that pertain to the evaluation of patients at risk for COVID-19 are in a state of rapid change based on information released by regulatory bodies including the CDC and federal and state organizations. These policies and algorithms were followed during the patient's care in the ED.   ____________________________________________  FINAL CLINICAL IMPRESSION(S) / ED  DIAGNOSES  Final diagnoses:  Right hamstring injury, initial encounter       NEW MEDICATIONS STARTED DURING THIS VISIT:  ED Discharge Orders         Ordered    Baclofen 5 MG TABS  3 times daily PRN     12/15/18 1512    oxyCODONE-acetaminophen (PERCOCET) 5-325 MG tablet  Every 8 hours PRN     12/15/18 1512    ibuprofen (ADVIL) 600 MG tablet  Every 6 hours PRN     12/15/18 1512    lidocaine (LIDODERM) 5 %  Every 24 hours     12/15/18 1512            This chart was dictated using voice recognition software/Dragon. Despite best efforts to proofread, errors can occur which can change the meaning. Any change was purely unintentional.    Enid Derry, PA-C 12/15/18 1717    Minna Antis, MD 12/16/18 2132

## 2018-12-15 NOTE — Discharge Instructions (Signed)
You have injured your hamstring. There is no fracture on your xray. Please take ibuprofen for pain and inflammation. Take baclofen to help relax your muscles. You can take percoet for extreme pain. Please use walker and knee immobilizer until followup with primary care or orthopedics.

## 2019-01-01 DIAGNOSIS — E782 Mixed hyperlipidemia: Secondary | ICD-10-CM | POA: Insufficient documentation

## 2019-01-17 ENCOUNTER — Other Ambulatory Visit: Payer: Self-pay

## 2019-01-17 ENCOUNTER — Other Ambulatory Visit: Payer: Self-pay | Admitting: *Deleted

## 2019-01-17 DIAGNOSIS — Z0000 Encounter for general adult medical examination without abnormal findings: Secondary | ICD-10-CM

## 2019-01-18 ENCOUNTER — Ambulatory Visit
Admission: RE | Admit: 2019-01-18 | Discharge: 2019-01-18 | Disposition: A | Discharge status: Home / Self Care | Payer: Self-pay | Source: Ambulatory Visit | Attending: Oncology | Admitting: Oncology

## 2019-01-18 ENCOUNTER — Encounter: Payer: Self-pay | Admitting: *Deleted

## 2019-01-18 ENCOUNTER — Ambulatory Visit: Payer: Self-pay | Attending: Oncology | Admitting: *Deleted

## 2019-01-18 DIAGNOSIS — Z0000 Encounter for general adult medical examination without abnormal findings: Secondary | ICD-10-CM

## 2019-01-18 DIAGNOSIS — Z1231 Encounter for screening mammogram for malignant neoplasm of breast: Secondary | ICD-10-CM | POA: Insufficient documentation

## 2019-01-18 NOTE — Progress Notes (Signed)
Due to Covid 19 pandemic, a televisit was used to enroll patient into our BCCCP program and complete her health history.  Verbal consent given.  Patient denies any breast problems.  Last pap may have been in 2017 per patient.  Since there is no pap result for review, patient will be scheduled to return at a later date for her pap when the Covid 19 numbers are better.  She is agreeable. Patient presented directly to the [facility] today for her screening mammogram.  Will follow up per BCCCP protocol.  See Dondra Spry model breast cancer risk assessment below.   Risk Assessment    No risk assessment data for the current encounter   Risk Scores      01/17/2019   Last edited by: Alta Corning, CMA   5-year risk: 2.2 %   Lifetime risk: 12 %

## 2019-01-19 ENCOUNTER — Encounter: Payer: Self-pay | Admitting: *Deleted

## 2019-01-19 NOTE — Progress Notes (Signed)
Letter mailed from the [facility] to inform patient of her normal mammogram results.  Patient is to follow-up with annual screening in one year. 

## 2019-02-28 DIAGNOSIS — Z1211 Encounter for screening for malignant neoplasm of colon: Secondary | ICD-10-CM | POA: Insufficient documentation

## 2019-04-13 ENCOUNTER — Ambulatory Visit: Payer: Self-pay | Attending: Internal Medicine

## 2019-04-13 DIAGNOSIS — Z20822 Contact with and (suspected) exposure to covid-19: Secondary | ICD-10-CM

## 2019-04-14 LAB — NOVEL CORONAVIRUS, NAA: SARS-CoV-2, NAA: DETECTED — AB

## 2019-04-14 LAB — SARS-COV-2, NAA 2 DAY TAT

## 2019-04-15 ENCOUNTER — Other Ambulatory Visit (HOSPITAL_COMMUNITY): Payer: Self-pay | Admitting: Nurse Practitioner

## 2019-04-15 ENCOUNTER — Ambulatory Visit (HOSPITAL_COMMUNITY)
Admission: RE | Admit: 2019-04-15 | Discharge: 2019-04-15 | Disposition: A | Discharge status: Home / Self Care | Payer: HRSA Program | Source: Ambulatory Visit | Attending: Pulmonary Disease | Admitting: Pulmonary Disease

## 2019-04-15 ENCOUNTER — Telehealth (HOSPITAL_COMMUNITY): Payer: Self-pay | Admitting: Nurse Practitioner

## 2019-04-15 ENCOUNTER — Ambulatory Visit: Payer: Self-pay

## 2019-04-15 DIAGNOSIS — U071 COVID-19: Secondary | ICD-10-CM | POA: Diagnosis not present

## 2019-04-15 MED ORDER — SODIUM CHLORIDE 0.9 % IV SOLN: INTRAVENOUS | Status: DC | PRN

## 2019-04-15 MED ORDER — METHYLPREDNISOLONE SODIUM SUCC 125 MG IJ SOLR: 125.0000 mg | Freq: Once | INTRAMUSCULAR | Status: DC | PRN

## 2019-04-15 MED ORDER — SODIUM CHLORIDE 0.9 % IV SOLN
700.0000 mg | Freq: Once | INTRAVENOUS | Status: AC
  Administered 2019-04-15: 700 mg via INTRAVENOUS
  Filled 2019-04-15: qty 700

## 2019-04-15 MED ORDER — DIPHENHYDRAMINE HCL 50 MG/ML IJ SOLN: 50.0000 mg | Freq: Once | INTRAMUSCULAR | Status: DC | PRN

## 2019-04-15 MED ORDER — EPINEPHRINE 0.3 MG/0.3ML IJ SOAJ: 0.3000 mg | Freq: Once | INTRAMUSCULAR | Status: DC | PRN

## 2019-04-15 MED ORDER — ALBUTEROL SULFATE HFA 108 (90 BASE) MCG/ACT IN AERS: 2.0000 | INHALATION_SPRAY | Freq: Once | RESPIRATORY_TRACT | Status: DC | PRN

## 2019-04-15 MED ORDER — FAMOTIDINE IN NACL 20-0.9 MG/50ML-% IV SOLN: 20.0000 mg | Freq: Once | INTRAVENOUS | Status: DC | PRN

## 2019-04-15 NOTE — Discharge Instructions (Signed)

## 2019-04-15 NOTE — Progress Notes (Signed)
I connected by phone with Kathee Delton on 04/15/2019 at 9:01 AM to discuss the potential use of an new treatment for mild to moderate COVID-19 viral infection in non-hospitalized patients.  This patient is a 59 y.o. female that meets the FDA criteria for Emergency Use Authorization of bamlanivimab or casirivimab\imdevimab.  Has a (+) direct SARS-CoV-2 viral test result  Has mild or moderate COVID-19   Is ? 57 years of age and weighs ? 40 kg  Is NOT hospitalized due to COVID-19  Is NOT requiring oxygen therapy or requiring an increase in baseline oxygen flow rate due to COVID-19  Is within 10 days of symptom onset  Has at least one of the high risk factor(s) for progression to severe COVID-19 and/or hospitalization as defined in EUA.  Specific high risk criteria : BMI >/= 35  I have spoken and communicated the following to the patient or parent/caregiver:  1. FDA has authorized the emergency use of bamlanivimab and casirivimab\imdevimab for the treatment of mild to moderate COVID-19 in adults and pediatric patients with positive results of direct SARS-CoV-2 viral testing who are 13 years of age and older weighing at least 40 kg, and who are at high risk for progressing to severe COVID-19 and/or hospitalization.  2. The significant known and potential risks and benefits of bamlanivimab and casirivimab\imdevimab, and the extent to which such potential risks and benefits are unknown.  3. Information on available alternative treatments and the risks and benefits of those alternatives, including clinical trials.  4. Patients treated with bamlanivimab and casirivimab\imdevimab should continue to self-isolate and use infection control measures (e.g., wear mask, isolate, social distance, avoid sharing personal items, clean and disinfect "high touch" surfaces, and frequent handwashing) according to CDC guidelines.   5. The patient or parent/caregiver has the option to accept or refuse  bamlanivimab or casirivimab\imdevimab .  After reviewing this information with the patient, The patient agreed to proceed with receiving the bamlanimivab infusion and will be provided a copy of the Fact sheet prior to receiving the infusion.Consuello Masse, DNP, AGNP-C [phone] (Infusion Center Hotline)

## 2019-04-15 NOTE — Telephone Encounter (Signed)
Called to Discuss with patient about Covid symptoms and the use of bamlanivimab, a monoclonal antibody infusion for those with mild to moderate Covid symptoms and at a high risk of hospitalization.     Pt is qualified for this infusion at the [facility] infusion center due to co-morbid conditions and/or a member of an at-risk group.     Symptoms started 2 days ago and have persisted since that time. Symptomatic of diarrhea. Qualifies for MAB. See orders encounter.   Consuello Masse, DNP, AGNP-C [phone] (Infusion Center Hotline)

## 2019-04-15 NOTE — Progress Notes (Signed)
  Diagnosis: COVID-19  Physician:  Procedure: Covid Infusion Clinic Med: bamlanivimab infusion - Provided patient with bamlanimivab fact sheet for patients, parents and caregivers prior to infusion.  Complications: No immediate complications noted.  Discharge: Discharged home   Gabrielle Williams 04/15/2019  

## 2019-05-23 ENCOUNTER — Ambulatory Visit: Payer: Self-pay | Attending: Internal Medicine

## 2019-05-23 DIAGNOSIS — Z20822 Contact with and (suspected) exposure to covid-19: Secondary | ICD-10-CM | POA: Insufficient documentation

## 2019-05-24 LAB — NOVEL CORONAVIRUS, NAA: SARS-CoV-2, NAA: NOT DETECTED

## 2019-05-24 LAB — SARS-COV-2, NAA 2 DAY TAT

## 2020-10-12 IMAGING — MG DIGITAL SCREENING BILAT W/ TOMO W/ CAD
6 of 10 series · 6 of 30 positions shown · non-contrast
Comparison: Previous exam(s).

CLINICAL DATA: Screening.

EXAM:
DIGITAL SCREENING BILATERAL MAMMOGRAM WITH TOMO AND CAD

[L CC synth-2D]
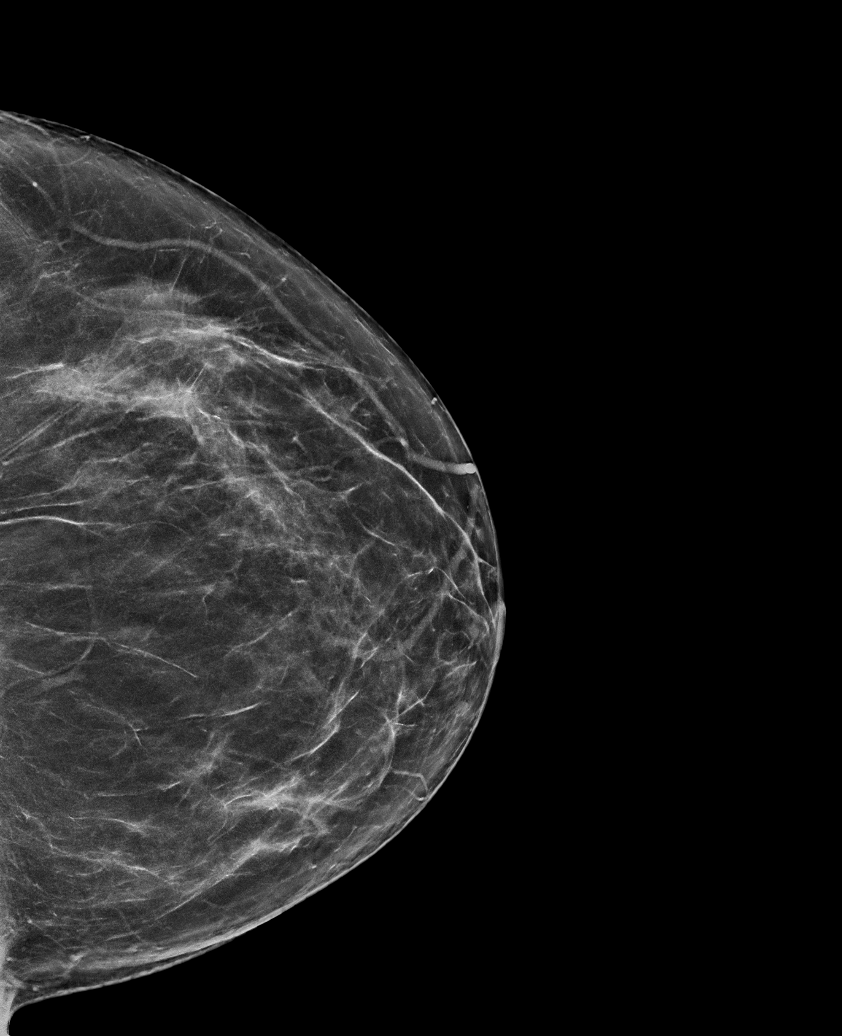

[R MLO synth-2D (1 of 2)]
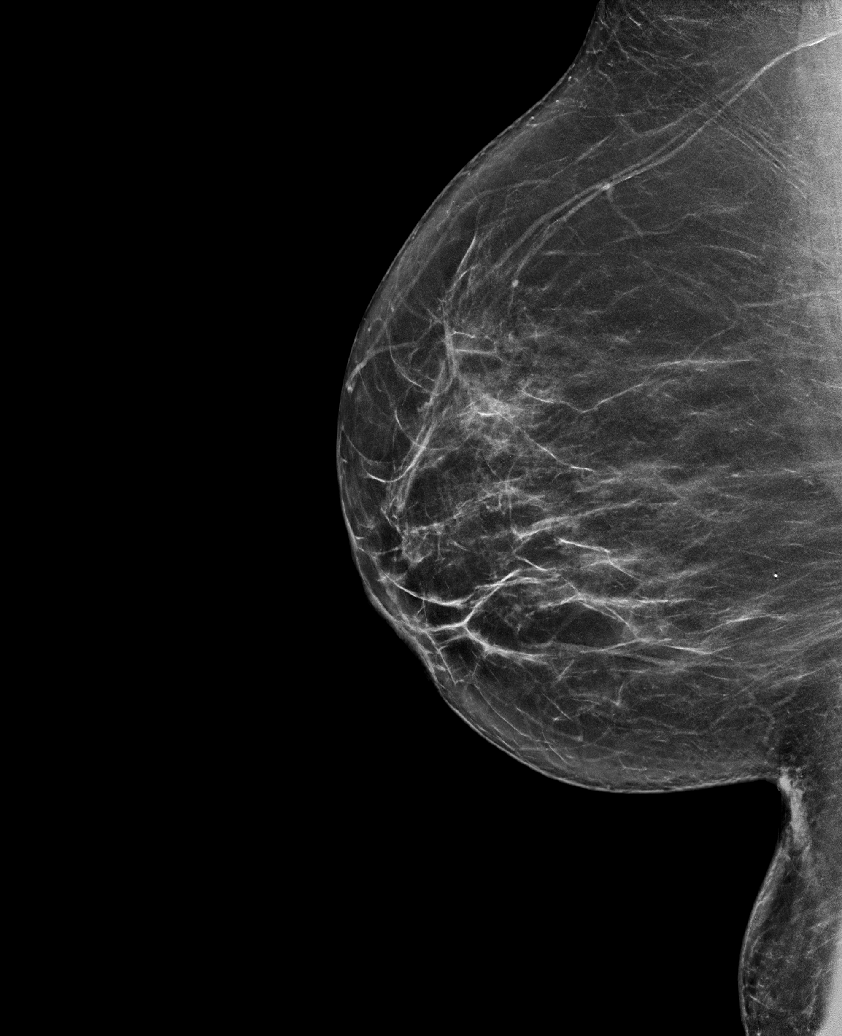

[R MLO synth-2D (2 of 2)]
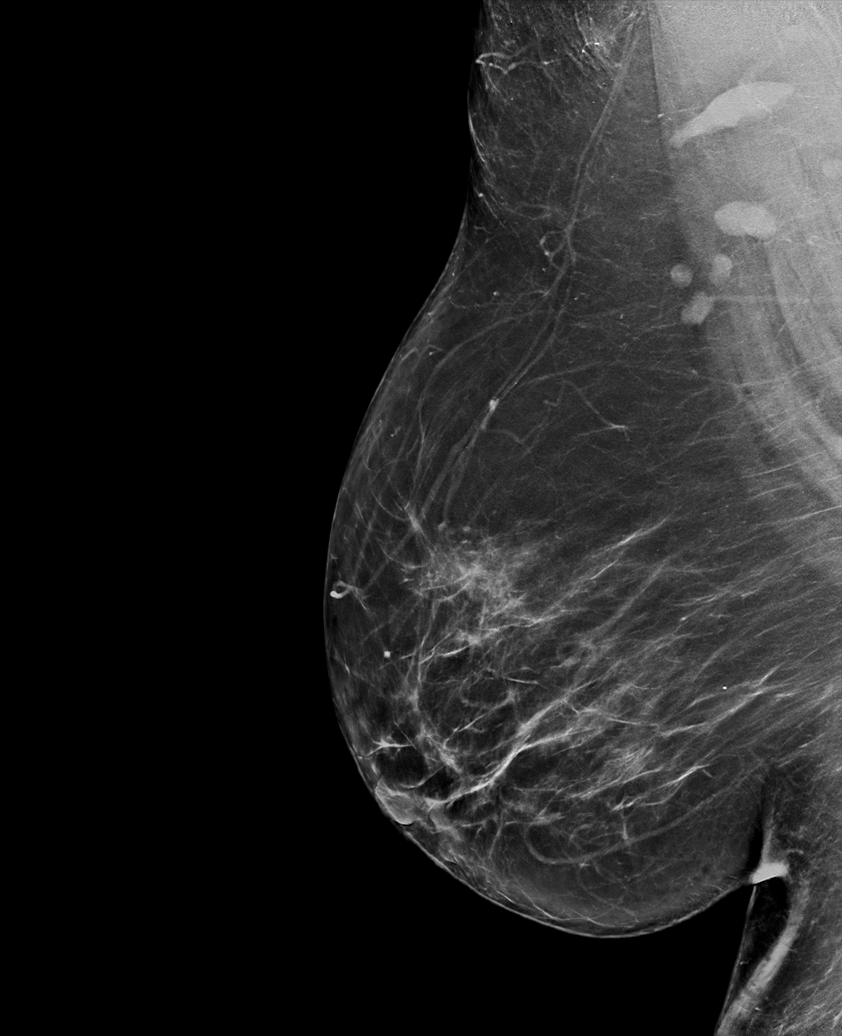

[R CC synth-2D]
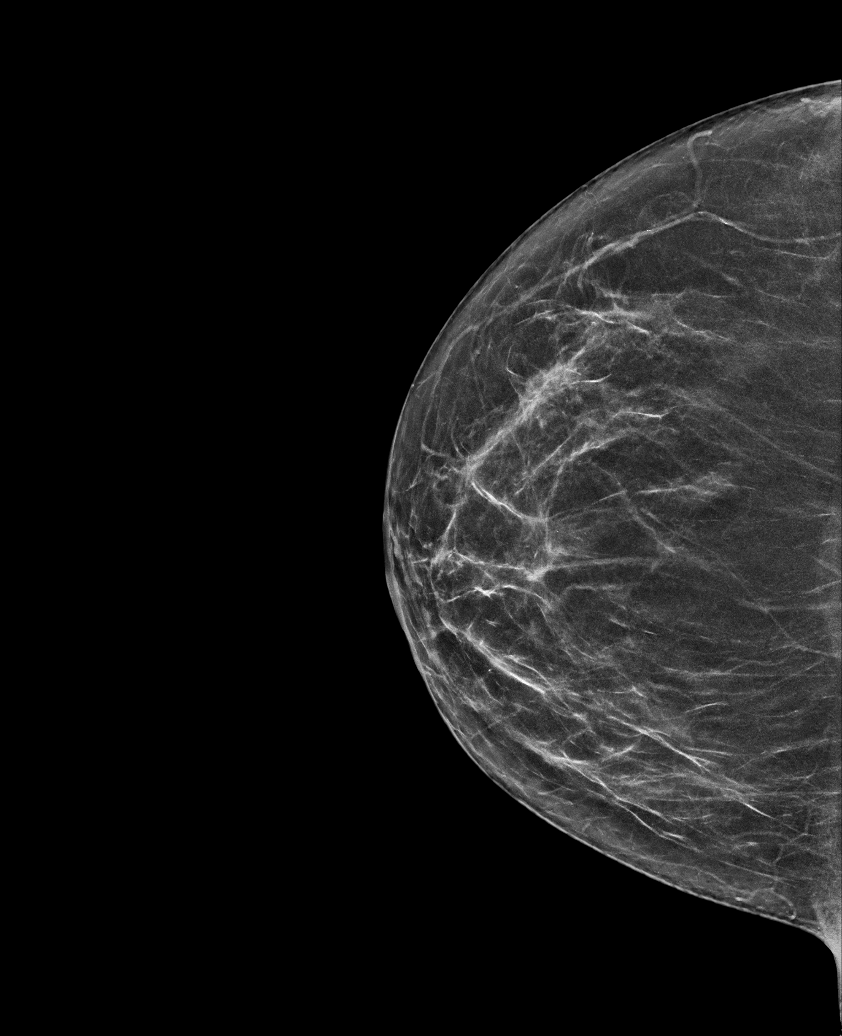

[L MLO synth-2D]
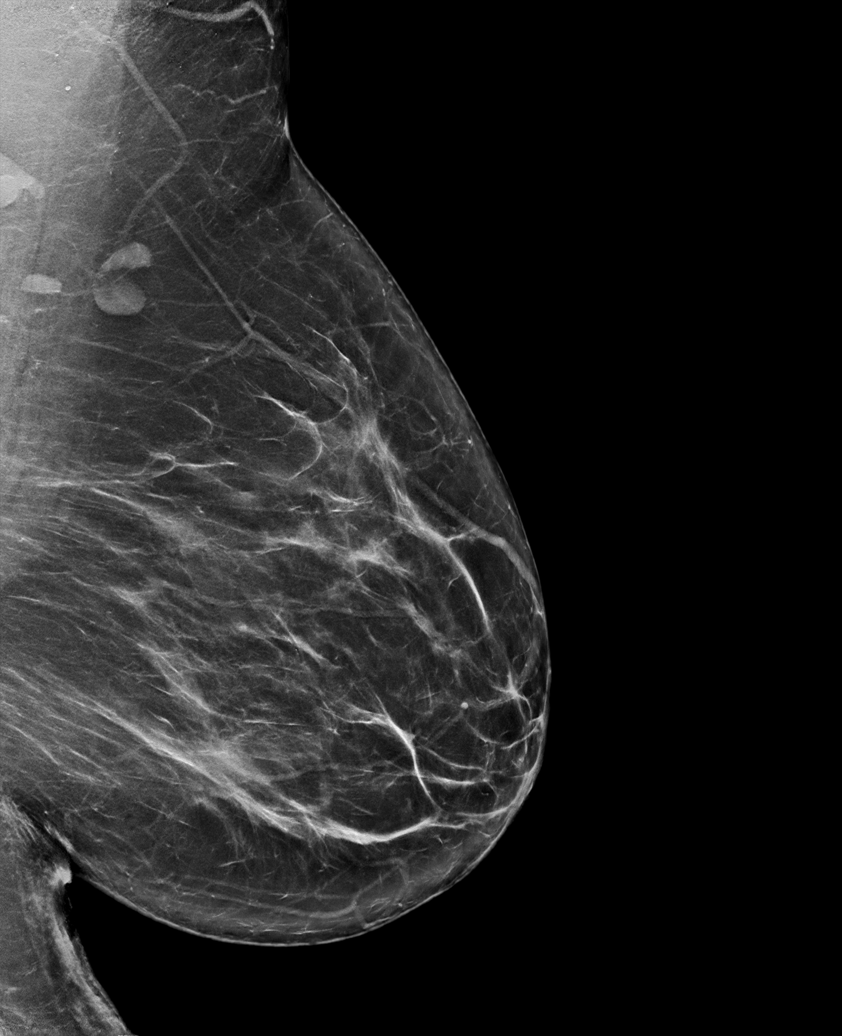

[L MLO tomo · tomo slice 40/79.0]
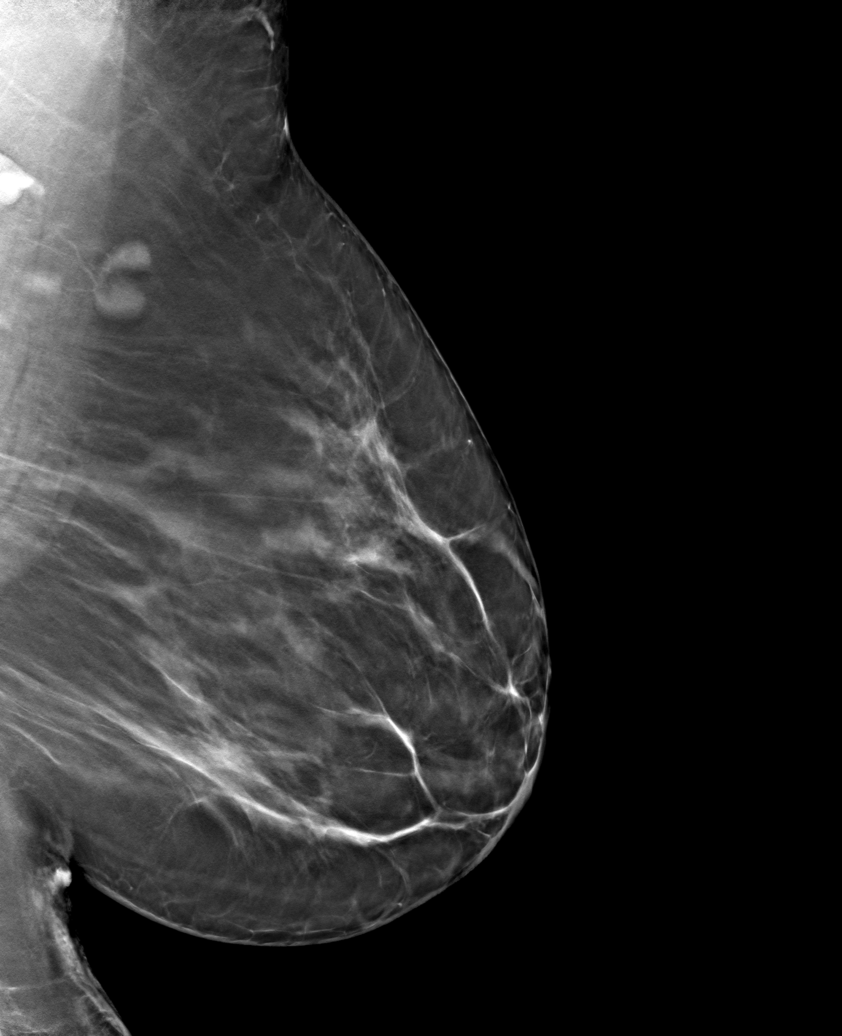

[6 of 30 positions shown; findings below may reference images not displayed]

ACR Breast Density Category b: There are scattered areas of
fibroglandular density.
FINDINGS: There are no findings suspicious for malignancy. Images were
processed with CAD.
IMPRESSION: No mammographic evidence of malignancy. A result letter of this
screening mammogram will be mailed directly to the patient.

RECOMMENDATION:
Screening mammogram in one year. (Code:[id number])

BI-RADS CATEGORY  1: Negative.

## 2021-08-17 ENCOUNTER — Emergency Department
Admission: EM | Admit: 2021-08-17 | Discharge: 2021-08-17 | Disposition: A | Discharge status: Home / Self Care | Payer: Medicare Other | Attending: Emergency Medicine | Admitting: Emergency Medicine

## 2021-08-17 ENCOUNTER — Other Ambulatory Visit: Payer: Self-pay

## 2021-08-17 DIAGNOSIS — S0502XA Injury of conjunctiva and corneal abrasion without foreign body, left eye, initial encounter: Secondary | ICD-10-CM | POA: Insufficient documentation

## 2021-08-17 DIAGNOSIS — X58XXXA Exposure to other specified factors, initial encounter: Secondary | ICD-10-CM | POA: Insufficient documentation

## 2021-08-17 MED ORDER — ERYTHROMYCIN 5 MG/GM OP OINT: 1.0000 | TOPICAL_OINTMENT | Freq: Every day | OPHTHALMIC | 0 refills | Status: DC

## 2021-08-17 MED ORDER — PROPARACAINE HCL 0.5 % OP SOLN
1.0000 [drp] | Freq: Once | OPHTHALMIC | Status: AC
  Administered 2021-08-17: 1 [drp] via OPHTHALMIC
  Filled 2021-08-17: qty 15

## 2021-08-17 MED ORDER — FLUORESCEIN SODIUM 1 MG OP STRP
1.0000 | ORAL_STRIP | Freq: Once | OPHTHALMIC | Status: AC
  Administered 2021-08-17: 1 via OPHTHALMIC
  Filled 2021-08-17: qty 1

## 2021-08-17 NOTE — ED Triage Notes (Signed)
Ambulatory to triage with c/o left eye pain. States her 33 month old grandchild scratched her eye this morning, now having redness, burning, and watering to eye.

## 2021-08-17 NOTE — ED Provider Notes (Signed)
[facility] Provider Note    Event Date/Time   First MD Initiated Contact with Patient 08/17/21 2020     (approximate)   History   Eye Problem   HPI  Gabrielle Williams is a 61 y.o. female who presents today for evaluation of left eye discomfort.  Patient reports that she was holding her 67-month-old grandson when she was scratched in her eye by him.  She reports that she has had foreign body sensation and tearing ever since.  She denies wearing any contact lenses.  She denies vision changes.  No headache.     Physical Exam   Triage Vital Signs: ED Triage Vitals [08/17/21 1907]  Enc Vitals Group     BP (!) 155/70     Pulse Rate 78     Resp 18     Temp 98 F (36.7 C)     Temp Source Oral     SpO2 100 %     Weight 230 lb (104.3 kg)     Height 5\' 4"  (1.626 m)     Head Circumference      Peak Flow      Pain Score 10     Pain Loc      Pain Edu?      Excl. in [place]?     Most recent vital signs: Vitals:   08/17/21 1907  BP: (!) 155/70  Pulse: 78  Resp: 18  Temp: 98 F (36.7 C)  SpO2: 100%    Physical Exam Vitals and nursing note reviewed.  Constitutional:      General: Awake and alert. No acute distress.    Appearance: Normal appearance. The patient is normal weight.  HENT:     Head: Normocephalic and atraumatic.     Mouth: Mucous membranes are moist.  Eyes:     General: PERRL. Normal EOMs        Right eye: No discharge.        Left eye: Scleral injection noted.  No periorbital erythema or ecchymosis.  No eyelid swelling.  There is a 3 x 2 mm area of fluorescein uptake between the 9 and 12 o'clock position.  No foreign body.  Eyelids everted.  Normal extraocular motion.  Pupil round and reactive to light.  Negative Seidel sign.  Pupil round and reactive    Conjunctiva/sclera: Conjunctivae normal.  Cardiovascular:     Rate and Rhythm: Normal rate and regular rhythm.     Pulses: Normal pulses.  Pulmonary:     Effort: Pulmonary effort is  normal. No respiratory distress.  Abdominal:     Abdomen is soft. There is no abdominal tenderness. Musculoskeletal:        General: No swelling. Normal range of motion.     Cervical back: Normal range of motion and neck supple.  Skin:    General: Skin is warm and dry.     Capillary Refill: Capillary refill takes less than 2 seconds.     Findings: No rash.  Neurological:     Mental Status: The patient is awake and alert.      ED Results / Procedures / Treatments   Labs (all labs ordered are listed, but only abnormal results are displayed) Labs Reviewed - No data to display   EKG     RADIOLOGY     PROCEDURES:  Critical Care performed:   Procedures   MEDICATIONS ORDERED IN ED: Medications  fluorescein ophthalmic strip 1 strip (has no administration in time  range)  proparacaine (ALCAINE) 0.5 % ophthalmic solution 1 drop (has no administration in time range)     IMPRESSION / MDM / ASSESSMENT AND PLAN / ED COURSE  I reviewed the triage vital signs and the nursing notes.   Differential diagnosis includes, but is not limited to, corneal abrasion, retained foreign body, open globe, preseptal or orbital cellulitis.  Patient is awake and alert, hemodynamically stable and afebrile.  There is no eyelid edema or periorbital erythema or edema to suggest a preseptal cellulitis.  She has no pain with extraocular movements, no proptosis, not consistent with orbital cellulitis.  Exam was performed with fluorescein and Woods lamp, there is a moderate sized corneal abrasion.  Negative Seidel sign, not consistent with open globe.  She denies any visual changes.  No retained foreign body.  She was treated with erythromycin ointment which she prefers to eyedrops.  She was instructed to follow-up with ophthalmology.  We discussed return precautions and the importance of close outpatient follow-up.  Patient understands and agrees with plan.  Discharged in stable condition.   Patient's  presentation is most consistent with acute illness / injury with system symptoms.   FINAL CLINICAL IMPRESSION(S) / ED DIAGNOSES   Final diagnoses:  Abrasion of left cornea, initial encounter     Rx / DC Orders   ED Discharge Orders          Ordered    erythromycin ophthalmic ointment  Daily at bedtime        08/17/21 2149             Note:  This document was prepared using Dragon voice recognition software and may include unintentional dictation errors.   Keturah Shavers 08/17/21 2154    Sharyn Creamer, MD 08/18/21 1757

## 2021-08-17 NOTE — Discharge Instructions (Addendum)
Use the ointment as prescribed.  Please return for any new, worsening, or change in symptoms or other concerns.

## 2021-08-19 ENCOUNTER — Other Ambulatory Visit: Payer: Self-pay

## 2021-08-19 DIAGNOSIS — Z1231 Encounter for screening mammogram for malignant neoplasm of breast: Secondary | ICD-10-CM

## 2021-08-20 ENCOUNTER — Ambulatory Visit
Admission: RE | Admit: 2021-08-20 | Discharge: 2021-08-20 | Disposition: A | Discharge status: Home / Self Care | Payer: Medicaid Other | Source: Ambulatory Visit | Attending: Obstetrics and Gynecology | Admitting: Obstetrics and Gynecology

## 2021-08-20 ENCOUNTER — Ambulatory Visit: Payer: Medicaid Other | Attending: Hematology and Oncology | Admitting: *Deleted

## 2021-08-20 VITALS — BP 128/58 | Wt 232.8 lb

## 2021-08-20 DIAGNOSIS — Z01419 Encounter for gynecological examination (general) (routine) without abnormal findings: Secondary | ICD-10-CM

## 2021-08-20 DIAGNOSIS — Z1231 Encounter for screening mammogram for malignant neoplasm of breast: Secondary | ICD-10-CM | POA: Insufficient documentation

## 2021-08-20 NOTE — Progress Notes (Signed)
[name] is a 61 y.o. female who presents to [facility] clinic today with no complaints .[name] presents for clinical breast exam and mammogram.    Pap Smear: Pap not smear completed today. Last Pap smear was on 01/29/19 at the [place] [facility] and was  negative per [facility], LPN . Per patient has no history of an abnormal Pap smear. Last Pap smear result is not available in Epic.   Physical exam: Breasts Breasts are asymmetrical. The left is larger than the right.  No skin abnormalities bilateral breasts. No nipple retraction bilateral breasts. No nipple discharge bilateral breasts. No lymphadenopathy. No lumps palpated bilateral breasts.       Pelvic/Bimanual Pap is not indicated today    Smoking History: Patient has is a former smoker .    Patient Navigation: Patient education provided. Taught self breast awareness.  Access to services provided for patient through [facility] program. No interpreter provided. No transportation provided   Colorectal Cancer Screening: Per patient has had colonoscopy completed on 06/15/19 and was benign with a tubulovillous adenoma.   No complaints today.    Breast and Cervical Cancer Risk Assessment: Patient has family history of breast cancer in her mother and maternal great aunt, known genetic mutations, or radiation treatment to the chest before age 68. Patient does not have history of cervical dysplasia, immunocompromised, or DES exposure in-utero.  Risk Assessment     Risk Scores       08/20/2021 01/17/2019   Last edited by: Gabrielle Rutherford, LPN [place], Gabrielle Williams, CMA   5-year risk: 2.3 % 2.2 %   Lifetime risk: 11.1 % 12 %            A: B[facility]CP exam without pap smear   P: Referred patient to the [facility] for a screening mammogram. Appointment scheduled today.  Will follow up per B[facility]CP protocol.  Gabrielle Like, RN 08/20/2021 8:41 AM

## 2021-11-26 ENCOUNTER — Other Ambulatory Visit: Payer: Self-pay | Admitting: Family Medicine

## 2021-11-26 DIAGNOSIS — Z1231 Encounter for screening mammogram for malignant neoplasm of breast: Secondary | ICD-10-CM

## 2022-01-13 ENCOUNTER — Emergency Department
Admission: EM | Admit: 2022-01-13 | Discharge: 2022-01-13 | Disposition: A | Discharge status: Home / Self Care | Payer: Medicare HMO | Attending: Emergency Medicine | Admitting: Emergency Medicine

## 2022-01-13 ENCOUNTER — Emergency Department: Payer: Medicare HMO

## 2022-01-13 DIAGNOSIS — R079 Chest pain, unspecified: Secondary | ICD-10-CM | POA: Diagnosis present

## 2022-01-13 DIAGNOSIS — J189 Pneumonia, unspecified organism: Secondary | ICD-10-CM | POA: Diagnosis not present

## 2022-01-13 LAB — URINALYSIS, ROUTINE W REFLEX MICROSCOPIC
Bacteria, UA: NONE SEEN
Bilirubin Urine: NEGATIVE
Glucose, UA: NEGATIVE mg/dL
Hgb urine dipstick: NEGATIVE
Ketones, ur: NEGATIVE mg/dL
Nitrite: NEGATIVE
Protein, ur: NEGATIVE mg/dL
Specific Gravity, Urine: 1.012 (ref 1.005–1.030)
pH: 5 (ref 5.0–8.0)

## 2022-01-13 LAB — CBC
HCT: 36.1 % (ref 36.0–46.0)
Hemoglobin: 12.3 g/dL (ref 12.0–15.0)
MCH: 29.6 pg (ref 26.0–34.0)
MCHC: 34.1 g/dL (ref 30.0–36.0)
MCV: 86.8 fL (ref 80.0–100.0)
Platelets: 230 10*3/uL (ref 150–400)
RBC: 4.16 MIL/uL (ref 3.87–5.11)
RDW: 12.7 % (ref 11.5–15.5)
WBC: 5.9 10*3/uL (ref 4.0–10.5)
nRBC: 0 % (ref 0.0–0.2)

## 2022-01-13 LAB — TROPONIN I (HIGH SENSITIVITY)
Troponin I (High Sensitivity): <2 ng/L (ref <18)
Troponin I (High Sensitivity): <2 ng/L (ref <18)

## 2022-01-13 LAB — BASIC METABOLIC PANEL
Anion gap: 6 (ref 5–15)
BUN: 13 mg/dL (ref 6–20)
CO2: 26 mmol/L (ref 22–32)
Calcium: 8.7 mg/dL — ABNORMAL LOW (ref 8.9–10.3)
Chloride: 106 mmol/L (ref 98–111)
Creatinine, Ser: 0.59 mg/dL (ref 0.44–1.00)
GFR, Estimated: >60 mL/min (ref >60)
Glucose, Bld: 99 mg/dL (ref 70–99)
Potassium: 4 mmol/L (ref 3.5–5.1)
Sodium: 138 mmol/L (ref 135–145)

## 2022-01-13 MED ORDER — AZITHROMYCIN 250 MG PO TABS: ORAL_TABLET | ORAL | 0 refills | Status: AC

## 2022-01-13 MED ORDER — CEPHALEXIN 500 MG PO CAPS: 500.0000 mg | ORAL_CAPSULE | Freq: Two times a day (BID) | ORAL | 0 refills | Status: DC

## 2022-01-13 MED ORDER — CEPHALEXIN 500 MG PO CAPS
500.0000 mg | ORAL_CAPSULE | Freq: Once | ORAL | Status: AC
  Administered 2022-01-13: 500 mg via ORAL
  Filled 2022-01-13: qty 1

## 2022-01-13 MED ORDER — AZITHROMYCIN 500 MG PO TABS
500.0000 mg | ORAL_TABLET | Freq: Once | ORAL | Status: AC
  Administered 2022-01-13: 500 mg via ORAL
  Filled 2022-01-13: qty 1

## 2022-01-13 NOTE — Discharge Instructions (Signed)
Please take antibiotic starting tomorrow 01/14/2022 as prescribed.  Return to the emergency department for any return of/worsening chest pain, or any other symptom personally concerning to yourself.  Otherwise please follow-up with your doctor in the next several days for recheck/reevaluation.

## 2022-01-13 NOTE — ED Notes (Signed)
Pt c/o central chest pain that radiates to the left side. Pt reports mild SHOB with exertion. Pt states "sometimes I can hear a whistle sound in my chest." Pt is AOX4, NAD noted.

## 2022-01-13 NOTE — ED Provider Notes (Signed)
[facility] Provider Note    Event Date/Time   First MD Initiated Contact with Patient 01/13/22 2235     (approximate)  History   Chief Complaint: Chest Pain  HPI  Gabrielle Williams is a 62 y.o. female with a past medical history of depression who presents emergency department for chest pain.  According to the patient earlier today she developed pain in the center of her chest as well as slight cough for the last couple days.  Patient states that the chest pain is not new for her but felt different today.  Patient states for the past few years she has been getting chest pain and shortness of breath.  She states she has been worked up for multiple times but has not had any cardiac interventions.  Patient denies any fever.  Denies any pleuritic pain.  Physical Exam   Triage Vital Signs: ED Triage Vitals  Enc Vitals Group     BP 01/13/22 1702 138/72     Pulse Rate 01/13/22 1702 (!) 56     Resp 01/13/22 1702 18     Temp 01/13/22 1702 98.4 F (36.9 C)     Temp Source 01/13/22 1702 Oral     SpO2 01/13/22 1702 99 %     Weight 01/13/22 1703 217 lb (98.4 kg)     Height 01/13/22 1703 5\' 4"  (1.626 m)     Head Circumference --      Peak Flow --      Pain Score 01/13/22 1703 6     Pain Loc --      Pain Edu? --      Excl. in [place]? --     Most recent vital signs: Vitals:   01/13/22 1702 01/13/22 2131  BP: 138/72 (!) 143/51  Pulse: (!) 56 (!) 55  Resp: 18 18  Temp: 98.4 F (36.9 C) 98.5 F (36.9 C)  SpO2: 99% 100%    General: Awake, no distress.  CV:  Good peripheral perfusion.  Regular rate and rhythm  Resp:  Normal effort.  Equal breath sounds bilaterally.  Clear lung sounds without wheeze rales or rhonchi.  Patient states sometimes at night she can hear a whistle coming from the chest, clear lung sounds currently. Abd:  No distention.  Soft, nontender.  No rebound or guarding.    ED Results / Procedures / Treatments   EKG  EKG viewed and interpreted  by myself shows sinus bradycardia 48 bpm with a narrow QRS, normal axis, normal intervals, no concerning ST changes.  RADIOLOGY  Chest x-ray viewed and interpreted by myself no consolidation seen on my evaluation. Radiology is read the chest x-ray is linear densities in the left lower lobe suggesting atelectasis or early pneumonia.   MEDICATIONS ORDERED IN ED: Medications  azithromycin (ZITHROMAX) tablet 500 mg (has no administration in time range)  cephALEXin (KEFLEX) capsule 500 mg (has no administration in time range)     IMPRESSION / MDM / ASSESSMENT AND PLAN / ED COURSE  I reviewed the triage vital signs and the nursing notes.  Patient's presentation is most consistent with acute presentation with potential threat to life or bodily function.  Patient presents emergency department for chest pain.  Patient states she has had chest pain intermittent over the years, no personal cardiac history.  Patient states tonight she was having a cough for the last 2 days and somewhat worsening chest discomfort than her typical chest pain so she came to the emergency  department for evaluation.  Patient denies any fever.  Denies any shortness of breath currently although states she has had intermittent shortness of breath but states that again has been ongoing times years.  Overall the patient appears well, no distress, reassuring physical exam.  EKG shows no concerning findings.  Troponin is reassuringly negative x 2, CBC is normal with a normal white blood cell count, chemistry is normal and urinalysis is normal.  Patient's chest x-ray however does show linear densities in the left lower lung suggesting possible early pneumonia given the patient's cough and chest pain we will cover with antibiotics.  Will have the patient follow-up with her doctor.  Patient agreeable to plan of care.  FINAL CLINICAL IMPRESSION(S) / ED DIAGNOSES   Community-acquired pneumonia Chest pain  Rx / DC Orders    Zithromax Keflex  Note:  This document was prepared using Dragon voice recognition software and may include unintentional dictation errors.   Harvest Dark, MD 01/13/22 2251

## 2022-01-13 NOTE — ED Triage Notes (Signed)
Pt presents to the ED via POV due to CP. Pt states she has been seen here before for similar symptoms. Pt states "I can hear it, it sounds like a hole is in there". Pt states she is a past smoker. Pt denies SOB and nvd. Pt A&Ox4

## 2022-01-13 NOTE — ED Provider Triage Note (Signed)
Emergency Medicine Provider Triage Evaluation Note  Gabrielle Williams, a 62 y.o. female  was evaluated in triage.  Pt complains of chest pain she has had symptoms similar to this before.  She reports symptom onset today, denies any associated N, V, or diaphoresis.  Review of Systems  Positive: CP, SOB Negative: NVD  Physical Exam  BP 138/72 (BP Location: Right Arm)   Pulse (!) 56   Temp 98.4 F (36.9 C) (Oral)   Resp 18   Ht 5\' 4"  (1.626 m)   Wt 98.4 kg   LMP 01/19/2014   SpO2 99%   BMI 37.25 kg/m  Gen:   Awake, no distress   Resp:  Normal effort  MSK:   Moves extremities without difficulty  Other:    Medical Decision Making  Medically screening exam initiated at 5:41 PM.  Appropriate orders placed.  FERNANDA TWADDELL was informed that the remainder of the evaluation will be completed by another provider, this initial triage assessment does not replace that evaluation, and the importance of remaining in the ED until their evaluation is complete.  Patient to the ED for evaluation of intermittent substernal chest pain with onset earlier today.  Patient reports a history of smoking and prior cardiac workup without findings.   Melvenia Needles, PA-C 01/13/22 1742

## 2022-01-14 LAB — PREGNANCY, URINE: Preg Test, Ur: NEGATIVE

## 2022-07-24 ENCOUNTER — Ambulatory Visit
Admission: RE | Admit: 2022-07-24 | Discharge: 2022-07-24 | Disposition: A | Discharge status: Home / Self Care | Payer: 59 | Source: Ambulatory Visit | Attending: Family Medicine | Admitting: Family Medicine

## 2022-07-24 DIAGNOSIS — Z1231 Encounter for screening mammogram for malignant neoplasm of breast: Secondary | ICD-10-CM | POA: Diagnosis present

## 2022-09-02 ENCOUNTER — Other Ambulatory Visit: Payer: Self-pay

## 2022-09-02 ENCOUNTER — Emergency Department
Admission: EM | Admit: 2022-09-02 | Discharge: 2022-09-02 | Disposition: A | Discharge status: Home / Self Care | Payer: 59 | Attending: Emergency Medicine | Admitting: Emergency Medicine

## 2022-09-02 ENCOUNTER — Emergency Department: Payer: 59

## 2022-09-02 DIAGNOSIS — E119 Type 2 diabetes mellitus without complications: Secondary | ICD-10-CM | POA: Diagnosis not present

## 2022-09-02 DIAGNOSIS — J069 Acute upper respiratory infection, unspecified: Secondary | ICD-10-CM | POA: Insufficient documentation

## 2022-09-02 DIAGNOSIS — R001 Bradycardia, unspecified: Secondary | ICD-10-CM | POA: Diagnosis not present

## 2022-09-02 DIAGNOSIS — R0602 Shortness of breath: Secondary | ICD-10-CM | POA: Diagnosis present

## 2022-09-02 DIAGNOSIS — R079 Chest pain, unspecified: Secondary | ICD-10-CM

## 2022-09-02 HISTORY — DX: Type 2 diabetes mellitus without complications: E11.9

## 2022-09-02 LAB — BASIC METABOLIC PANEL
Anion gap: 6 (ref 5–15)
BUN: 8 mg/dL (ref 6–20)
CO2: 24 mmol/L (ref 22–32)
Calcium: 8.4 mg/dL — ABNORMAL LOW (ref 8.9–10.3)
Chloride: 108 mmol/L (ref 98–111)
Creatinine, Ser: 0.67 mg/dL (ref 0.44–1.00)
GFR, Estimated: >60 mL/min (ref >60)
Glucose, Bld: 133 mg/dL — ABNORMAL HIGH (ref 70–99)
Potassium: 3.7 mmol/L (ref 3.5–5.1)
Sodium: 138 mmol/L (ref 135–145)

## 2022-09-02 LAB — CBC
HCT: 36.9 % (ref 36.0–46.0)
Hemoglobin: 12.4 g/dL (ref 12.0–15.0)
MCH: 29.7 pg (ref 26.0–34.0)
MCHC: 33.6 g/dL (ref 30.0–36.0)
MCV: 88.3 fL (ref 80.0–100.0)
Platelets: 241 10*3/uL (ref 150–400)
RBC: 4.18 MIL/uL (ref 3.87–5.11)
RDW: 11.7 % (ref 11.5–15.5)
WBC: 3.1 10*3/uL — ABNORMAL LOW (ref 4.0–10.5)
nRBC: 0 % (ref 0.0–0.2)

## 2022-09-02 LAB — TROPONIN I (HIGH SENSITIVITY): Troponin I (High Sensitivity): 5 ng/L (ref <18)

## 2022-09-02 MED ORDER — KETOROLAC TROMETHAMINE 15 MG/ML IJ SOLN
15.0000 mg | Freq: Once | INTRAMUSCULAR | Status: AC
  Administered 2022-09-02: 15 mg via INTRAMUSCULAR
  Filled 2022-09-02: qty 1

## 2022-09-02 MED ORDER — NAPROXEN 500 MG PO TABS: 500.0000 mg | ORAL_TABLET | Freq: Two times a day (BID) | ORAL | 0 refills | Status: AC

## 2022-09-02 MED ORDER — NAPROXEN 500 MG PO TABS: 500.0000 mg | ORAL_TABLET | Freq: Two times a day (BID) | ORAL | 0 refills | Status: DC

## 2022-09-02 NOTE — Discharge Instructions (Signed)
You were seen in the emergency room today for your chest pain and congestion.  Your testing was fortunately reassuring.  I sent a prescription for naproxen to your pharmacy that you can take as needed to help with your chest pain.  Follow-up with your primary care doctor for further evaluation.  Return to the ER for new or worsening symptoms.

## 2022-09-02 NOTE — ED Provider Notes (Signed)
[facility] Provider Note    Event Date/Time   First MD Initiated Contact with Patient 09/02/22 1112     (approximate)   History   Covid Positive   HPI  Gabrielle Williams is a 62 y.o. female with history of diabetes presenting to the emergency department for evaluation of shortness of breath.  Patient had upper respiratory symptoms last week took a home COVID test that was positive.  She was beginning to improve, but yesterday began to develop worsening symptoms including cough, congestion.  Does report some chest tightness similar to when she has previously had respiratory illnesses.  No fevers.  Tolerating oral intake.  Was concerned that she had worsening symptoms after improvement leading to ER presentation.    Physical Exam   Triage Vital Signs: ED Triage Vitals  Encounter Vitals Group     BP 09/02/22 1041 123/60     Systolic BP Percentile --      Diastolic BP Percentile --      Pulse Rate 09/02/22 1038 (!) 51     Resp 09/02/22 1038 18     Temp 09/02/22 1038 98.5 F (36.9 C)     Temp src --      SpO2 09/02/22 1038 99 %     Weight 09/02/22 1039 220 lb (99.8 kg)     Height 09/02/22 1039 5\' 4"  (1.626 m)     Head Circumference --      Peak Flow --      Pain Score 09/02/22 1039 0     Pain Loc --      Pain Education --      Exclude from Growth Chart --     Most recent vital signs: Vitals:   09/02/22 1038 09/02/22 1041  BP:  123/60  Pulse: (!) 51   Resp: 18   Temp: 98.5 F (36.9 C)   SpO2: 99%      General: Awake, interactive  HEENT: Upper airway congestion noted, moist mucous membranes CV:  Bradycardic with regular rhythm, normal peripheral perfusion Resp:  Lungs clear, unlabored respirations.  Abd:  Soft, nondistended.  Neuro:  Symmetric facial movement, fluid speech   ED Results / Procedures / Treatments   Labs (all labs ordered are listed, but only abnormal results are displayed) Labs Reviewed  BASIC METABOLIC PANEL -  Abnormal; Notable for the following components:      Result Value   Glucose, Bld 133 (*)    Calcium 8.4 (*)    All other components within normal limits  CBC - Abnormal; Notable for the following components:   WBC 3.1 (*)    All other components within normal limits  TROPONIN I (HIGH SENSITIVITY)     EKG EKG independently reviewed interpreted by myself (ER attending) demonstrates:  EKG demonstrates sinus bradycardia rate of 51, PR 164, QRS 81, QTc 433, no acute ST changes  RADIOLOGY Imaging independently reviewed and interpreted by myself demonstrates:  CXR without focal consolidation  PROCEDURES:  Critical Care performed: No  Procedures   MEDICATIONS ORDERED IN ED: Medications  ketorolac (TORADOL) 15 MG/ML injection 15 mg (15 mg Intramuscular Given 09/02/22 1136)     IMPRESSION / MDM / ASSESSMENT AND PLAN / ED COURSE  I reviewed the triage vital signs and the nursing notes.  Differential diagnosis includes, but is not limited to, ongoing sequela from recent COVID infection, superimposed pneumonia, pneumothorax, much lower suspicion ACS, PE  Patient's presentation is most consistent with acute presentation with  potential threat to life or bodily function.  62 year old female presenting to the emergency department for evaluation of recurrent upper respiratory symptoms in the setting of recent COVID infection.  Vital signs without severe derangement.  Labs reassuring.  X-Kadesha Virrueta fortunately without evidence of superimposed pneumonia.  Patient was given Toradol with significant improvement in her pain.  Suspect possible component of pleurisy.  Patient is comfortable with discharge home.  Strict return precautions provided.  Patient discharged stable condition.      FINAL CLINICAL IMPRESSION(S) / ED DIAGNOSES   Final diagnoses:  Upper respiratory tract infection, unspecified type  Nonspecific chest pain     Rx / DC Orders   ED Discharge Orders          Ordered     naproxen (NAPROSYN) 500 MG tablet  2 times daily with meals        09/02/22 1226             Note:  This document was prepared using Dragon voice recognition software and may include unintentional dictation errors.   Trinna Post, MD 09/02/22 1226

## 2022-09-02 NOTE — ED Triage Notes (Signed)
Pt to ED for continued covid sx. Reports tested positive last week. Reports chest pain and shob last night, denies pain at this time.

## 2022-12-15 DIAGNOSIS — R7989 Other specified abnormal findings of blood chemistry: Secondary | ICD-10-CM | POA: Insufficient documentation

## 2022-12-30 ENCOUNTER — Encounter: Payer: 59 | Admitting: Internal Medicine

## 2022-12-30 ENCOUNTER — Inpatient Hospital Stay: Payer: 59

## 2022-12-30 ENCOUNTER — Other Ambulatory Visit: Payer: Self-pay

## 2022-12-30 ENCOUNTER — Inpatient Hospital Stay: Payer: 59 | Attending: Internal Medicine | Admitting: Internal Medicine

## 2022-12-30 VITALS — BP 131/58 | HR 56 | Temp 98.2°F | Resp 16 | Wt 225.0 lb

## 2022-12-30 DIAGNOSIS — Z803 Family history of malignant neoplasm of breast: Secondary | ICD-10-CM | POA: Diagnosis not present

## 2022-12-30 DIAGNOSIS — Z800 Family history of malignant neoplasm of digestive organs: Secondary | ICD-10-CM | POA: Diagnosis not present

## 2022-12-30 DIAGNOSIS — R7989 Other specified abnormal findings of blood chemistry: Secondary | ICD-10-CM

## 2022-12-30 DIAGNOSIS — Z87891 Personal history of nicotine dependence: Secondary | ICD-10-CM | POA: Insufficient documentation

## 2022-12-30 DIAGNOSIS — Z801 Family history of malignant neoplasm of trachea, bronchus and lung: Secondary | ICD-10-CM | POA: Insufficient documentation

## 2022-12-30 DIAGNOSIS — F1090 Alcohol use, unspecified, uncomplicated: Secondary | ICD-10-CM | POA: Insufficient documentation

## 2022-12-30 LAB — COMPREHENSIVE METABOLIC PANEL
ALT: 19 U/L (ref 0–44)
AST: 16 U/L (ref 15–41)
Albumin: 4.3 g/dL (ref 3.5–5.0)
Alkaline Phosphatase: 74 U/L (ref 38–126)
Anion gap: 8 (ref 5–15)
BUN: 15 mg/dL (ref 6–20)
CO2: 25 mmol/L (ref 22–32)
Calcium: 8.9 mg/dL (ref 8.9–10.3)
Chloride: 104 mmol/L (ref 98–111)
Creatinine, Ser: 1.06 mg/dL — ABNORMAL HIGH (ref 0.44–1.00)
GFR, Estimated: >60 mL/min (ref >60)
Glucose, Bld: 135 mg/dL — ABNORMAL HIGH (ref 70–99)
Potassium: 4.1 mmol/L (ref 3.5–5.1)
Sodium: 137 mmol/L (ref 135–145)
Total Bilirubin: 0.3 mg/dL (ref <1.2)
Total Protein: 7.3 g/dL (ref 6.5–8.1)

## 2022-12-30 LAB — CBC WITH DIFFERENTIAL/PLATELET
Abs Immature Granulocytes: 0.03 10*3/uL (ref 0.00–0.07)
Basophils Absolute: 0 10*3/uL (ref 0.0–0.1)
Basophils Relative: 0 %
Eosinophils Absolute: 0.1 10*3/uL (ref 0.0–0.5)
Eosinophils Relative: 2 %
HCT: 33.5 % — ABNORMAL LOW (ref 36.0–46.0)
Hemoglobin: 11.5 g/dL — ABNORMAL LOW (ref 12.0–15.0)
Immature Granulocytes: 1 %
Lymphocytes Relative: 23 %
Lymphs Abs: 1.2 10*3/uL (ref 0.7–4.0)
MCH: 29.9 pg (ref 26.0–34.0)
MCHC: 34.3 g/dL (ref 30.0–36.0)
MCV: 87 fL (ref 80.0–100.0)
Monocytes Absolute: 0.5 10*3/uL (ref 0.1–1.0)
Monocytes Relative: 8 %
Neutro Abs: 3.5 10*3/uL (ref 1.7–7.7)
Neutrophils Relative %: 66 %
Platelets: 224 10*3/uL (ref 150–400)
RBC: 3.85 MIL/uL — ABNORMAL LOW (ref 3.87–5.11)
RDW: 13 % (ref 11.5–15.5)
WBC: 5.4 10*3/uL (ref 4.0–10.5)
nRBC: 0 % (ref 0.0–0.2)

## 2022-12-30 LAB — IRON AND TIBC
Iron: 100 ug/dL (ref 28–170)
Saturation Ratios: 27 % (ref 10.4–31.8)
TIBC: 365 ug/dL (ref 250–450)
UIBC: 265 ug/dL

## 2022-12-30 LAB — SEDIMENTATION RATE: Sed Rate: 12 mm/h (ref 0–30)

## 2022-12-30 LAB — FERRITIN: Ferritin: 888 ng/mL — ABNORMAL HIGH (ref 11–307)

## 2022-12-30 NOTE — Progress Notes (Signed)
Gabrielle Williams  Telephone:(336) [phone] Fax:(336) [phone]  ID: MACAIAH WALSH OB: 01/24/1960  MR#: [id number]  AOZ#:[id number]  Patient Care Team: Shane Crutch, PA as PCP - General (Family Medicine) Eulogio Bear, Carlye Grippe, RN Michaelyn Barter, MD as Consulting Physician (Oncology)  REFERRING PROVIDER: Shane Crutch  REASON FOR REFERRAL: Elevated ferritin level  HPI: REMII ADRIANO is a 62 y.o. female with past med history of prediabetes, hyperlipidemia, bilateral lower extremity swelling referred to hematology for workup of elevated ferritin level.  Patient was seen at [facility] as clearance for plasma donor.  She had routine blood work which showed elevated ferritin of 1000.  Saturation 16%.  CBC showed normal hemoglobin, WBC and platelets.  LFTs are normal.  Patient reports prior history of heavy alcohol use on daily basis.  Used to drink beer about 6 packs.  For the past few years, reports has cut down to every other week.  Drinks wine 3 glasses.  Former smoker stopped in 2011.  Denies any other recreational drug use.  Denies any family history of hereditary hemochromatosis.  Has chronic lower back pain and hip pain.   REVIEW OF SYSTEMS:   ROS  As per HPI. Otherwise, a complete review of systems is negative.  PAST MEDICAL HISTORY: Past Medical History:  Diagnosis Date   Back pain    Depression    Diabetes mellitus without complication (HCC)    Sickle cell trait (HCC)     PAST SURGICAL HISTORY: Past Surgical History:  Procedure Laterality Date   CESAREAN SECTION     TUBAL LIGATION      FAMILY HISTORY: Family History  Problem Relation Age of Onset   Breast cancer Mother 100   Colon cancer Father    Lung cancer Brother     HEALTH MAINTENANCE: Social History   Tobacco Use   Smoking status: Former    Current packs/day: 0.00    Types: Cigarettes    Quit date: 07/29/2009    Years since quitting: 13.4  Vaping Use   Vaping  status: Never Used  Substance Use Topics   Alcohol use: Yes    Comment: occasional beer   Drug use: No     No Known Allergies  Current Outpatient Medications  Medication Sig Dispense Refill   atorvastatin (LIPITOR) 20 MG tablet Take 20 mg by mouth at bedtime.     Baclofen 5 MG TABS Take 5 mg by mouth 3 (three) times daily as needed. 15 tablet 0   cephALEXin (KEFLEX) 500 MG capsule Take 1 capsule (500 mg total) by mouth 2 (two) times daily. 14 capsule 0   diclofenac (VOLTAREN) 50 MG EC tablet Take 1 tablet (50 mg total) by mouth 2 (two) times daily. 30 tablet 1   erythromycin ophthalmic ointment Place 1 Application into the left eye at bedtime. 3.5 g 0   escitalopram (LEXAPRO) 10 MG tablet Take by mouth.     fenofibrate 160 MG tablet Take 160 mg by mouth daily.     furosemide (LASIX) 40 MG tablet Take 40 mg by mouth daily.     ibuprofen (ADVIL) 600 MG tablet Take 1 tablet (600 mg total) by mouth every 6 (six) hours as needed. 30 tablet 0   lidocaine (LIDODERM) 5 % Place 1 patch onto the skin daily. Remove & Discard patch within 12 hours or as directed by MD 30 patch 0   meclizine (ANTIVERT) 12.5 MG tablet Take 1 tablet (12.5 mg total) by mouth 3 (  three) times daily as needed for dizziness or nausea. 30 tablet 1   metFORMIN (GLUCOPHAGE) 500 MG tablet Take 500 mg by mouth daily.     traMADol (ULTRAM) 50 MG tablet Take 50 mg by mouth every 6 (six) hours as needed. Two every four hours     traMADol (ULTRAM) 50 MG tablet Take 1 tablet (50 mg total) by mouth 3 (three) times daily. 90 tablet 2   zolpidem (AMBIEN) 5 MG tablet Take 5 mg by mouth at bedtime as needed.     traMADol (ULTRAM) 50 MG tablet Take 1 tablet (50 mg total) by mouth 2 (two) times daily. (Patient not taking: Reported on 12/30/2022) 60 tablet 2   No current facility-administered medications for this visit.    OBJECTIVE: Vitals:   12/30/22 1126  BP: (!) 131/58  Pulse: (!) 56  Resp: 16  Temp: 98.2 F (36.8 C)  SpO2:  98%     Body mass index is 38.62 kg/m.      General: Well-developed, well-nourished, no acute distress. Eyes: Pink conjunctiva, anicteric sclera. HEENT: Normocephalic, moist mucous membranes, clear oropharnyx. Lungs: Clear to auscultation bilaterally. Heart: Regular rate and rhythm. No rubs, murmurs, or gallops. Abdomen: Soft, nontender, nondistended. No organomegaly noted, normoactive bowel sounds. Musculoskeletal: No edema, cyanosis, or clubbing. Neuro: Alert, answering all questions appropriately. Cranial nerves grossly intact. Skin: No rashes or petechiae noted. Psych: Normal affect. Lymphatics: No cervical, calvicular, axillary or inguinal LAD.   LAB RESULTS:  Lab Results  Component Value Date   NA 137 12/30/2022   K 4.1 12/30/2022   CL 104 12/30/2022   CO2 25 12/30/2022   GLUCOSE 135 (H) 12/30/2022   BUN 15 12/30/2022   CREATININE 1.06 (H) 12/30/2022   CALCIUM 8.9 12/30/2022   PROT 7.3 12/30/2022   ALBUMIN 4.3 12/30/2022   AST 16 12/30/2022   ALT 19 12/30/2022   ALKPHOS 74 12/30/2022   BILITOT 0.3 12/30/2022   GFRNONAA >60 12/30/2022   GFRAA >60 07/20/2014    Lab Results  Component Value Date   WBC 5.4 12/30/2022   NEUTROABS 3.5 12/30/2022   HGB 11.5 (L) 12/30/2022   HCT 33.5 (L) 12/30/2022   MCV 87.0 12/30/2022   PLT 224 12/30/2022    Lab Results  Component Value Date   TIBC 365 12/30/2022   FERRITIN 888 (H) 12/30/2022   IRONPCTSAT 27 12/30/2022     STUDIES: No results found.  ASSESSMENT AND PLAN:   TANESHA VULLO is a 62 y.o. female with pmh of prediabetes, hyperlipidemia, bilateral lower extremity swelling referred to hematology for workup of elevated ferritin level.  # Elevated ferritin -Of unknown etiology.  Today did on blood work done for clearance for plasma donor.  Ferritin from 12/01/2022 was 1000.  Saturation 16%. -Patient reports history of heavy alcohol drinking which can cause liver damage and elevation in ferritin level.  Will  obtain ultrasound of the abdomen to assess for liver.  I will also check for hemochromatosis gene panel.  Suspicion is low with negative family history and with her age.  But will rule out the etiology. -I will also request the lab to add on ESR and CRP level.  Orders Placed This Encounter  Procedures   US Abdomen Complete   CBC with Differential/Platelet   Iron and TIBC   Ferritin   Comprehensive metabolic panel   Hemochromatosis DNA-PCR(c282y,h63d)   Sedimentation rate   C-reactive protein   Ambulatory referral to Social Work   RTC in 3  weeks to discuss labs and ultrasound.  Patient expressed understanding and was in agreement with this plan. She also understands that She can call clinic at any time with any questions, concerns, or complaints.   I spent a total of 45 minutes reviewing chart data, face-to-face evaluation with the patient, counseling and coordination of care as detailed above.  Michaelyn Barter, MD   12/30/2022 2:37 PM

## 2022-12-30 NOTE — Progress Notes (Signed)
Since 2020 she has been having shortness of breath and sometimes it feels like something is sitting on her chest. She does have an appointment for an endoscopy in February 2025.

## 2022-12-30 NOTE — Progress Notes (Signed)
[facility] Clinical Social Work  Initial Assessment   Gabrielle Williams is a 62 y.o. year old female contacted by phone. Clinical Social Work was referred by medical provider for assessment of psychosocial needs.   SDOH (Social Determinants of Health) assessments performed: Yes   SDOH Screenings   Food Insecurity: Food Insecurity Present (12/30/2022)  Housing: Unknown (12/30/2022)  Transportation Needs: Unmet Transportation Needs (12/30/2022)  Utilities: Not At Risk (12/30/2022)  Depression (PHQ2-9): Low Risk  (12/30/2022)  Tobacco Use: Medium Risk (09/18/2022)   [facility] from [facility]     Distress Screen completed: No     No data to display            Family/Social Information:  Housing Arrangement: patient lives alone Family members/support persons in your life? Family Transportation concerns: No.  Patient denied. Employment: Unemployed Income source: Patient did not disclose. Financial concerns: No Type of concern: None Food access concerns: Patient denied.  She receives food stamps and the [employer]. Religious or spiritual practice: No Services Currently in place:  Medicare and Medicaid.  Coping/ Adjustment to diagnosis: Patient understands treatment plan and what happens next? yes Concerns about diagnosis and/or treatment:  fear of the unknown. Patient reported stressors: Adjusting to my illness Hopes and/or priorities: Family Patient enjoys time with family/ friends Current coping skills/ strengths: Capable of independent living , Manufacturing systems engineer , General fund of knowledge , and Supportive family/friends     SUMMARY: Current SDOH Barriers:  Patient denied.  Clinical Social Work Clinical Goal(s):  No clinical social work goals at this time  Interventions: Discussed common feeling and emotions when being diagnosed with cancer, and the importance of support during treatment Informed patient of the support team roles and support services at [facility] Provided  CSW contact information and encouraged patient to call with any questions or concerns Provided patient with information about CSW role and services available.   Follow Up Plan: Patient will contact CSW with any support or resource needs Patient verbalizes understanding of plan: Yes    Gabrielle Baseman, LCSW Clinical Social Worker [facility]  Patient is participating in a Managed Medicaid Plan:  Yes

## 2023-01-05 LAB — HEMOCHROMATOSIS DNA-PCR(C282Y,H63D)

## 2023-01-08 ENCOUNTER — Ambulatory Visit
Admission: RE | Admit: 2023-01-08 | Discharge: 2023-01-08 | Disposition: A | Discharge status: Home / Self Care | Payer: 59 | Source: Ambulatory Visit | Attending: Internal Medicine | Admitting: Internal Medicine

## 2023-01-08 DIAGNOSIS — R7989 Other specified abnormal findings of blood chemistry: Secondary | ICD-10-CM | POA: Diagnosis present

## 2023-01-22 ENCOUNTER — Inpatient Hospital Stay: Payer: 59 | Attending: Internal Medicine | Admitting: Internal Medicine

## 2023-01-22 ENCOUNTER — Encounter: Payer: Self-pay | Admitting: Internal Medicine

## 2023-01-22 ENCOUNTER — Encounter: Payer: Self-pay | Admitting: *Deleted

## 2023-01-22 VITALS — BP 148/63 | HR 52 | Temp 98.1°F | Resp 14 | Wt 227.0 lb

## 2023-01-22 DIAGNOSIS — R7989 Other specified abnormal findings of blood chemistry: Secondary | ICD-10-CM | POA: Diagnosis not present

## 2023-01-22 DIAGNOSIS — K7689 Other specified diseases of liver: Secondary | ICD-10-CM | POA: Diagnosis not present

## 2023-01-22 DIAGNOSIS — K769 Liver disease, unspecified: Secondary | ICD-10-CM | POA: Diagnosis not present

## 2023-01-22 DIAGNOSIS — R9389 Abnormal findings on diagnostic imaging of other specified body structures: Secondary | ICD-10-CM | POA: Diagnosis not present

## 2023-01-22 DIAGNOSIS — Z87891 Personal history of nicotine dependence: Secondary | ICD-10-CM | POA: Insufficient documentation

## 2023-01-22 DIAGNOSIS — R932 Abnormal findings on diagnostic imaging of liver and biliary tract: Secondary | ICD-10-CM

## 2023-01-22 NOTE — Progress Notes (Signed)
 Patient has an appointment coming up in feb. 2025 to have an endoscopy, due to her chest pain ongoing for about 4 years now.

## 2023-01-22 NOTE — Progress Notes (Signed)
 Letter given to patient for [facility] blood draw.

## 2023-01-26 DIAGNOSIS — K769 Liver disease, unspecified: Secondary | ICD-10-CM | POA: Insufficient documentation

## 2023-01-26 DIAGNOSIS — R7989 Other specified abnormal findings of blood chemistry: Secondary | ICD-10-CM | POA: Insufficient documentation

## 2023-01-26 NOTE — Progress Notes (Signed)
 [facility]  Telephone:(336) [phone] Fax:(336) [phone]  ID: Gabrielle Williams OB: 1960-11-16  MR#: [id number]  RDW#:[id number]  Patient Care Team: Donnie Handing, PA as PCP - General (Family Medicine) Driscilla, Wanda SQUIBB, RN Clista Bimler, MD as Consulting Physician (Oncology)  REFERRING PROVIDER: Handing Donnie  REASON FOR REFERRAL: Elevated ferritin level  HPI: Gabrielle Williams is a 63 y.o. female with past med history of prediabetes, hyperlipidemia, bilateral lower extremity swelling referred to hematology for workup of elevated ferritin level.  Patient was seen at [facility] primary [facility] as clearance for plasma donor.  She had routine blood work which showed elevated ferritin of 1000.  Saturation 16%.  CBC showed normal hemoglobin, WBC and platelets.  LFTs are normal.  Patient reports prior history of heavy alcohol use on daily basis.  Used to drink beer about 6 packs.  For the past few years, has cut down to every other week.  Drinks wine 3 glasses.  Former smoker stopped in 2011.  Denies any other recreational drug use.  Denies any family history of hereditary hemochromatosis.  Has chronic lower back pain and hip pain.  Repeat iron panel from 12/30/2022 showed ferritin 888, saturation 27%.  LFTs normal.  Hemochromatosis panel negative for C282Y and H63D.  Ultrasound abdomen from 01/08/2023 showed 4.4 cm right liver cyst.  Possible hypoechoic mass near the gallbladder fossa measuring 3.4 cm.  Heterogeneous and increased hepatic parenchymal echogenicity could be seen with hepatic steatosis.  Spleen is mildly enlarged.  Interval history Patient was seen today as follow-up to discuss labs and ultrasound results. Patient is doing well overall.  Denies any new concerns.  REVIEW OF SYSTEMS:   ROS  As per HPI. Otherwise, a complete review of systems is negative.  PAST MEDICAL HISTORY: Past Medical History:  Diagnosis Date   Back pain    Depression     Diabetes mellitus without complication (HCC)    Sickle cell trait (HCC)     PAST SURGICAL HISTORY: Past Surgical History:  Procedure Laterality Date   CESAREAN SECTION     TUBAL LIGATION      FAMILY HISTORY: Family History  Problem Relation Age of Onset   Breast cancer Mother 39   Colon cancer Father    Lung cancer Brother     HEALTH MAINTENANCE: Social History   Tobacco Use   Smoking status: Former    Current packs/day: 0.00    Types: Cigarettes    Quit date: 07/29/2009    Years since quitting: 13.5  Vaping Use   Vaping status: Never Used  Substance Use Topics   Alcohol use: Yes    Comment: occasional beer   Drug use: No     No Known Allergies  Current Outpatient Medications  Medication Sig Dispense Refill   atorvastatin  (LIPITOR) 20 MG tablet Take 20 mg by mouth at bedtime.     Baclofen  5 MG TABS Take 5 mg by mouth 3 (three) times daily as needed. 15 tablet 0   cephALEXin  (KEFLEX ) 500 MG capsule Take 1 capsule (500 mg total) by mouth 2 (two) times daily. 14 capsule 0   diclofenac  (VOLTAREN ) 50 MG EC tablet Take 1 tablet (50 mg total) by mouth 2 (two) times daily. 30 tablet 1   erythromycin  ophthalmic ointment Place 1 Application into the left eye at bedtime. 3.5 g 0   escitalopram  (LEXAPRO ) 10 MG tablet Take by mouth.     fenofibrate  160 MG tablet Take 160 mg by mouth daily.  furosemide  (LASIX ) 40 MG tablet Take 40 mg by mouth daily.     ibuprofen  (ADVIL ) 600 MG tablet Take 1 tablet (600 mg total) by mouth every 6 (six) hours as needed. 30 tablet 0   lidocaine  (LIDODERM ) 5 % Place 1 patch onto the skin daily. Remove & Discard patch within 12 hours or as directed by MD 30 patch 0   meclizine  (ANTIVERT ) 12.5 MG tablet Take 1 tablet (12.5 mg total) by mouth 3 (three) times daily as needed for dizziness or nausea. 30 tablet 1   metFORMIN  (GLUCOPHAGE ) 500 MG tablet Take 500 mg by mouth daily.     traMADol  (ULTRAM ) 50 MG tablet Take 50 mg by mouth every 6 (six)  hours as needed. Two every four hours     traMADol  (ULTRAM ) 50 MG tablet Take 1 tablet (50 mg total) by mouth 2 (two) times daily. (Patient not taking: Reported on 12/30/2022) 60 tablet 2   traMADol  (ULTRAM ) 50 MG tablet Take 1 tablet (50 mg total) by mouth 3 (three) times daily. 90 tablet 2   zolpidem (AMBIEN) 5 MG tablet Take 5 mg by mouth at bedtime as needed.     No current facility-administered medications for this visit.    OBJECTIVE: Vitals:   01/22/23 1424 01/22/23 1429  BP: (!) 158/75 (!) 148/63  Pulse: (!) 52   Resp: 14   Temp: 98.1 F (36.7 C)   SpO2: 100%      Body mass index is 38.96 kg/m.      General: Well-developed, well-nourished, no acute distress. Eyes: Pink conjunctiva, anicteric sclera. HEENT: Normocephalic, moist mucous membranes, clear oropharnyx. Lungs: Clear to auscultation bilaterally. Heart: Regular rate and rhythm. No rubs, murmurs, or gallops. Abdomen: Soft, nontender, nondistended. No organomegaly noted, normoactive bowel sounds. Musculoskeletal: No edema, cyanosis, or clubbing. Neuro: Alert, answering all questions appropriately. Cranial nerves grossly intact. Skin: No rashes or petechiae noted. Psych: Normal affect. Lymphatics: No cervical, calvicular, axillary or inguinal LAD.   LAB RESULTS:  Lab Results  Component Value Date   NA 137 12/30/2022   K 4.1 12/30/2022   CL 104 12/30/2022   CO2 25 12/30/2022   GLUCOSE 135 (H) 12/30/2022   BUN 15 12/30/2022   CREATININE 1.06 (H) 12/30/2022   CALCIUM  8.9 12/30/2022   PROT 7.3 12/30/2022   ALBUMIN 4.3 12/30/2022   AST 16 12/30/2022   ALT 19 12/30/2022   ALKPHOS 74 12/30/2022   BILITOT 0.3 12/30/2022   GFRNONAA >60 12/30/2022   GFRAA >60 07/20/2014    Lab Results  Component Value Date   WBC 5.4 12/30/2022   NEUTROABS 3.5 12/30/2022   HGB 11.5 (L) 12/30/2022   HCT 33.5 (L) 12/30/2022   MCV 87.0 12/30/2022   PLT 224 12/30/2022    Lab Results  Component Value Date   TIBC 365  12/30/2022   FERRITIN 888 (H) 12/30/2022   IRONPCTSAT 27 12/30/2022     STUDIES: US  Abdomen Complete Result Date: 01/08/2023 CLINICAL DATA:  elevated ferritin, Hx of alcohol use EXAM: ABDOMEN ULTRASOUND COMPLETE COMPARISON:  None Available. FINDINGS: Gallbladder: No gallstones or wall thickening visualized. No sonographic Murphy sign noted by sonographer. Common bile duct: Diameter: Visualized portion measures 3 mm, within normal limits. Liver: There is a cyst within the RIGHT liver measuring up to 4.4 cm. There is a possible hypoechoic mass near the gallbladder fossa measuring approximately 3.4 cm (image 54 and 97). Heterogeneous and overall increased parenchymal echogenicity. Portal vein is patent on color Doppler imaging with  normal direction of blood flow towards the liver. IVC: No abnormality visualized. Pancreas: Visualized portion unremarkable. Spleen: Spleen measures 12.7 x 5.3 x 12.0 cm for estimated volume of 420 ML. Right Kidney: Length: 12.7 cm. Echogenicity within normal limits. No mass or hydronephrosis visualized. Left Kidney: Length: 12.8 cm. Echogenicity within normal limits. No mass or hydronephrosis visualized. Abdominal aorta: No aneurysm visualized. Portions are not visualized secondary to shadowing bowel gas. Other findings: None. IMPRESSION: 1. There is a possible hypoechoic mass versus artifact near the gallbladder fossa measuring approximately 3.4 cm. Recommend further evaluation with dedicated MRI with and without contrast. 2. Heterogeneous and overall increased hepatic parenchymal echogenicity. This is nonspecific but can be seen in the setting of hepatic steatosis or underlying liver disease. 3. Spleen is mildly enlarged Electronically Signed   By: Corean Salter M.D.   On: 01/08/2023 11:38    ASSESSMENT AND PLAN:   Gabrielle Williams is a 63 y.o. female with pmh of prediabetes, hyperlipidemia, bilateral lower extremity swelling referred to hematology for workup of elevated  ferritin level.  # Elevated ferritin -Could be related to liver injury from prior heavy alcohol use, obesity, iron supplementation.  -Blood work done for clearance for plasma donor by PCP. Ferritin from 12/01/2022 was 1000.  Saturation 16%. Repeat iron panel from 12/30/2022 showed ferritin 888, saturation 27%.  LFTs normal.  Hemochromatosis panel negative for C282Y and H63D.  ESR is normal.  - Ultrasound abdomen from 01/08/2023 showed 4.4 cm right liver cyst.  Possible hypoechoic mass near the gallbladder fossa measuring 3.4 cm.  Heterogeneous and increased hepatic parenchymal echogenicity could be seen with hepatic steatosis.  Spleen is mildly enlarged.  -Inflammatory marker is normal.  Ferritin is still elevated at 888 but saturation is low for the degree of ferritin elevation 27%.  Considering saturation is less than 45%, less likely to be related to iron overload.  Ferritin may be elevated from liver injury from prior heavy alcohol use, hepatic steatosis from obesity.  Patient has been taking iron supplementation as she was advised by the blood bank because her hematocrit was borderline and was not cleared for blood donation.  She has stopped it about a month ago.  Will obtain MRI liver with and without contrast as recommended by radiology to reassess the mass and for any evidence of iron deposition.  Discussed about lifestyle modifications such as weight loss, dietary changes.  She has cut down alcohol intake to once or twice a month.  She can consider blood donation at blood bank to decrease ferritin level.  Previously because of low hematocrit she was not cleared.  We have given her a letter to [facility] for blood donation with smaller volume 250 mL about 3-4 times a year as needed.  I will follow-up with her in 3 months with labs and MRI.  Orders Placed This Encounter  Procedures   MR LIVER W WO CONTRAST   CBC with Differential (Cancer Center Only)   Ferritin   Iron and TIBC([facility])    RTC in 3 months for MD visit, labs, MRI of the liver.  Patient expressed understanding and was in agreement with this plan. She also understands that She can call clinic at any time with any questions, concerns, or complaints.   I spent a total of 30 minutes reviewing chart data, face-to-face evaluation with the patient, counseling and coordination of care as detailed above.  Genevia Rous, MD   01/26/2023 12:59 PM

## 2023-01-29 ENCOUNTER — Ambulatory Visit
Admission: RE | Admit: 2023-01-29 | Discharge: 2023-01-29 | Disposition: A | Discharge status: Home / Self Care | Payer: 59 | Source: Ambulatory Visit | Attending: Internal Medicine | Admitting: Internal Medicine

## 2023-01-29 ENCOUNTER — Telehealth: Payer: Self-pay | Admitting: *Deleted

## 2023-01-29 DIAGNOSIS — K769 Liver disease, unspecified: Secondary | ICD-10-CM

## 2023-01-29 DIAGNOSIS — R932 Abnormal findings on diagnostic imaging of liver and biliary tract: Secondary | ICD-10-CM

## 2023-01-29 DIAGNOSIS — R9389 Abnormal findings on diagnostic imaging of other specified body structures: Secondary | ICD-10-CM | POA: Diagnosis present

## 2023-01-29 MED ORDER — GADOBUTROL 1 MMOL/ML IV SOLN
10.0000 mL | Freq: Once | INTRAVENOUS | Status: AC | PRN
  Administered 2023-01-29: 10 mL via INTRAVENOUS

## 2023-01-29 NOTE — Telephone Encounter (Signed)
Received msg from MRI dept. Pt's 1/9 MRI ordered by Dr. Alena Bills is not signed by provider. I contacted on call md- Dr. Smith Robert, who agreed to sign order. New order placed and sent to Dr. Smith Robert to sign.

## 2023-03-03 ENCOUNTER — Ambulatory Visit: Payer: 59 | Admitting: Gastroenterology

## 2023-03-27 ENCOUNTER — Telehealth: Payer: Self-pay | Admitting: *Deleted

## 2023-03-27 NOTE — Telephone Encounter (Signed)
 It was faxed to [phone] and it went through with fax transmission.

## 2023-04-22 ENCOUNTER — Inpatient Hospital Stay: Payer: 59 | Attending: Internal Medicine

## 2023-04-22 DIAGNOSIS — R7989 Other specified abnormal findings of blood chemistry: Secondary | ICD-10-CM | POA: Diagnosis present

## 2023-04-22 DIAGNOSIS — K7689 Other specified diseases of liver: Secondary | ICD-10-CM | POA: Insufficient documentation

## 2023-04-22 DIAGNOSIS — I959 Hypotension, unspecified: Secondary | ICD-10-CM | POA: Insufficient documentation

## 2023-04-22 LAB — IRON AND TIBC
Iron: 63 ug/dL (ref 28–170)
Saturation Ratios: 17 % (ref 10.4–31.8)
TIBC: 364 ug/dL (ref 250–450)
UIBC: 301 ug/dL

## 2023-04-22 LAB — FERRITIN: Ferritin: 712 ng/mL — ABNORMAL HIGH (ref 11–307)

## 2023-04-22 LAB — CBC WITH DIFFERENTIAL (CANCER CENTER ONLY)
Abs Immature Granulocytes: 0.03 10*3/uL (ref 0.00–0.07)
Basophils Absolute: 0 10*3/uL (ref 0.0–0.1)
Basophils Relative: 0 %
Eosinophils Absolute: 0.1 10*3/uL (ref 0.0–0.5)
Eosinophils Relative: 2 %
HCT: 35 % — ABNORMAL LOW (ref 36.0–46.0)
Hemoglobin: 12 g/dL (ref 12.0–15.0)
Immature Granulocytes: 1 %
Lymphocytes Relative: 26 %
Lymphs Abs: 1.3 10*3/uL (ref 0.7–4.0)
MCH: 29.1 pg (ref 26.0–34.0)
MCHC: 34.3 g/dL (ref 30.0–36.0)
MCV: 85 fL (ref 80.0–100.0)
Monocytes Absolute: 0.5 10*3/uL (ref 0.1–1.0)
Monocytes Relative: 11 %
Neutro Abs: 2.9 10*3/uL (ref 1.7–7.7)
Neutrophils Relative %: 60 %
Platelet Count: 234 10*3/uL (ref 150–400)
RBC: 4.12 MIL/uL (ref 3.87–5.11)
RDW: 12.9 % (ref 11.5–15.5)
WBC Count: 4.8 10*3/uL (ref 4.0–10.5)
nRBC: 0 % (ref 0.0–0.2)

## 2023-04-24 ENCOUNTER — Encounter: Payer: Self-pay | Admitting: Internal Medicine

## 2023-04-24 ENCOUNTER — Inpatient Hospital Stay (HOSPITAL_BASED_OUTPATIENT_CLINIC_OR_DEPARTMENT_OTHER): Payer: 59 | Admitting: Internal Medicine

## 2023-04-24 VITALS — BP 130/70 | HR 49 | Temp 97.8°F | Resp 16 | Wt 224.0 lb

## 2023-04-24 DIAGNOSIS — K769 Liver disease, unspecified: Secondary | ICD-10-CM

## 2023-04-24 DIAGNOSIS — R7989 Other specified abnormal findings of blood chemistry: Secondary | ICD-10-CM

## 2023-04-24 DIAGNOSIS — I959 Hypotension, unspecified: Secondary | ICD-10-CM | POA: Insufficient documentation

## 2023-04-24 DIAGNOSIS — E861 Hypovolemia: Secondary | ICD-10-CM

## 2023-04-24 NOTE — Patient Instructions (Addendum)
 Patient follows with Hematology for elevated ferritin.  Work up so far has been negative Could be related to NASH from obesity.  There is no indication for phlebotomy MRI liver is reassuring.  Please continue to follow with your PCP and they can monitor iron levels 1-2 times a year. If ferritin is consistently going up,, can be referred back to Hematology  Your blood pressure went low likely from Lasix you took yesterday.  Please have BP check at pharmacy over the weekend. If still low, please schedule f/u visit with PCP. If red flag symptoms as discussed to go to ER.  Hydrate well.

## 2023-04-24 NOTE — Progress Notes (Addendum)
 [facility]  [phone] [phone]  ID: [name] OB: Jan 28, 1960  MR#: [id number]  AOZ#:[id number]  Patient Care Team: Shane Crutch, PA as PCP - General (Family Medicine) Brannock, Carlye Grippe, RN Michaelyn Barter, MD as Consulting Physician (Oncology)  Reason for visit: Elevated ferritin level likely secondary to hepatic steatosis from obesity.  HPI: Gabrielle Williams is a 63 y.o. female with past med history of prediabetes, hyperlipidemia, bilateral lower extremity swelling referred to hematology for workup of elevated ferritin level.  Patient was seen at [facility] primary clinic as clearance for plasma donor.  She had routine blood work which showed elevated ferritin of 1000.  Saturation 16%.  CBC showed normal hemoglobin, WBC and platelets.  LFTs are normal.  Patient reports prior history of heavy alcohol use on daily basis.  Used to drink beer about 6 packs.  For the past few years, has cut down to every other week.  Drinks wine 3 glasses.  Former smoker stopped in 2011.  Denies any other recreational drug use.  Denies any family history of hereditary hemochromatosis.  Has chronic lower back pain and hip pain.  Repeat iron panel from 12/30/2022 showed ferritin 888, saturation 27%.  LFTs normal.  Hemochromatosis panel negative for C282Y and H63D.  Ultrasound abdomen from 01/08/2023 showed 4.4 cm right liver cyst.  Possible hypoechoic mass near the gallbladder fossa measuring 3.4 cm.  Heterogeneous and increased hepatic parenchymal echogenicity could be seen with hepatic steatosis.  Spleen is mildly enlarged.  MRI liver from 01/29/2023 showed benign simple cyst measuring 3.7 cm in segment 8.  Tiny separate focus measuring 3 mm.  Patent portal vein.  No ascites or splenomegaly.  No signs of iron overload.  Interval history Patient seen today as follow-up for elevated ferritin level, discuss MRI and labs. Doing well overall.  She does have  chronic shortness of breath since COVID 2020.  Occasional dizziness chronic.  No worsening of symptoms recently.  She took a dose of Lasix 40 mg yesterday for her ankle swelling.  She has  not eaten anything today.  In the clinic, blood pressure low at 70/52. Asymptomatic.   REVIEW OF SYSTEMS:   ROS  As per HPI. Otherwise, a complete review of systems is negative.  PAST MEDICAL HISTORY: Past Medical History:  Diagnosis Date   Back pain    Depression    Diabetes mellitus without complication (HCC)    Sickle cell trait (HCC)     PAST SURGICAL HISTORY: Past Surgical History:  Procedure Laterality Date   CESAREAN SECTION     TUBAL LIGATION      FAMILY HISTORY: Family History  Problem Relation Age of Onset   Breast cancer Mother 40   Colon cancer Father    Lung cancer Brother     HEALTH MAINTENANCE: Social History   Tobacco Use   Smoking status: Former    Current packs/day: 0.00    Types: Cigarettes    Quit date: 07/29/2009    Years since quitting: 13.7  Vaping Use   Vaping status: Never Used  Substance Use Topics   Alcohol use: Yes    Comment: occasional beer   Drug use: No     No Known Allergies  Current Outpatient Medications  Medication Sig Dispense Refill   atorvastatin (LIPITOR) 20 MG tablet Take 20 mg by mouth at bedtime.     Baclofen 5 MG TABS Take 5 mg by mouth 3 (three) times daily as needed. 15 tablet  0   diclofenac (VOLTAREN) 50 MG EC tablet Take 1 tablet (50 mg total) by mouth 2 (two) times daily. 30 tablet 1   erythromycin ophthalmic ointment Place 1 Application into the left eye at bedtime. 3.5 g 0   escitalopram (LEXAPRO) 10 MG tablet Take by mouth.     fenofibrate 160 MG tablet Take 160 mg by mouth daily.     furosemide (LASIX) 40 MG tablet Take 40 mg by mouth daily.     ibuprofen (ADVIL) 600 MG tablet Take 1 tablet (600 mg total) by mouth every 6 (six) hours as needed. 30 tablet 0   lidocaine (LIDODERM) 5 % Place 1 patch onto the skin daily.  Remove & Discard patch within 12 hours or as directed by MD 30 patch 0   meclizine (ANTIVERT) 12.5 MG tablet Take 1 tablet (12.5 mg total) by mouth 3 (three) times daily as needed for dizziness or nausea. 30 tablet 1   metFORMIN (GLUCOPHAGE) 500 MG tablet Take 500 mg by mouth daily.     zolpidem (AMBIEN) 5 MG tablet Take 5 mg by mouth at bedtime as needed. Take 2 at bedtime.     No current facility-administered medications for this visit.    OBJECTIVE: Vitals:   04/24/23 1316 04/24/23 1347  BP: (!) 70/52 130/70  Pulse: (!) 49   Resp: 16   Temp: 97.8 F (36.6 C)   SpO2: 100%      Body mass index is 38.45 kg/m.      General: Well-developed, well-nourished, no acute distress. Eyes: Pink conjunctiva, anicteric sclera. HEENT: Normocephalic, moist mucous membranes, clear oropharnyx. Lungs: Clear to auscultation bilaterally. Heart: Regular rate and rhythm. No rubs, murmurs, or gallops. Abdomen: Soft, nontender, nondistended. No organomegaly noted, normoactive bowel sounds. Musculoskeletal: No edema, cyanosis, or clubbing. Neuro: Alert, answering all questions appropriately. Cranial nerves grossly intact. Skin: No rashes or petechiae noted. Psych: Normal affect. Lymphatics: No cervical, calvicular, axillary or inguinal LAD.   LAB RESULTS:  Lab Results  Component Value Date   NA 137 12/30/2022   K 4.1 12/30/2022   CL 104 12/30/2022   CO2 25 12/30/2022   GLUCOSE 135 (H) 12/30/2022   BUN 15 12/30/2022   CREATININE 1.06 (H) 12/30/2022   CALCIUM 8.9 12/30/2022   PROT 7.3 12/30/2022   ALBUMIN 4.3 12/30/2022   AST 16 12/30/2022   ALT 19 12/30/2022   ALKPHOS 74 12/30/2022   BILITOT 0.3 12/30/2022   GFRNONAA >60 12/30/2022   GFRAA >60 07/20/2014    Lab Results  Component Value Date   WBC 4.8 04/22/2023   NEUTROABS 2.9 04/22/2023   HGB 12.0 04/22/2023   HCT 35.0 (L) 04/22/2023   MCV 85.0 04/22/2023   PLT 234 04/22/2023    Lab Results  Component Value Date   TIBC 364  04/22/2023   TIBC 365 12/30/2022   FERRITIN 712 (H) 04/22/2023   FERRITIN 888 (H) 12/30/2022   IRONPCTSAT 17 04/22/2023   IRONPCTSAT 27 12/30/2022     STUDIES: No results found.   ASSESSMENT AND PLAN:   TAMMATHA COBB is a 63 y.o. female with pmh of prediabetes, hyperlipidemia, bilateral lower extremity swelling referred to hematology for workup of elevated ferritin level.  # Elevated ferritin -Could be related to liver injury from prior heavy alcohol use, obesity, iron supplementation.  -Blood work done for clearance for plasma donor by PCP. Ferritin from 12/01/2022 was 1000.  Saturation 16%. Repeat iron panel from 12/30/2022 showed ferritin 888, saturation 27%.  LFTs normal.  Hemochromatosis panel negative for C282Y and H63D.  ESR is normal.  - Ultrasound abdomen from 01/08/2023 showed 4.4 cm right liver cyst.  Possible hypoechoic mass near the gallbladder fossa measuring 3.4 cm.  Heterogeneous and increased hepatic parenchymal echogenicity could be seen with hepatic steatosis.  Spleen is mildly enlarged.  -MRI liver from 01/29/2023 showed benign simple cyst measuring 3.7 cm in segment 8.  Tiny separate focus measuring 3 mm.  Patent portal vein.  No ascites or splenomegaly.  No signs of iron overload.  -Repeat labs today showed ferritin of 712.  Saturation 17%.  Suspicion for iron overload is low with negative workup and saturation of less than 45%.  It could be related to steatosis from obesity.  She drinks about 1 to 2 glasses of wine twice a month. Stopped Iron supplementation. MRI liver showed no signs of iron overload. No indication for phlebotomy.  Patient advised to continue to follow with PCP and can monitor iron levels 1-2 times a year. If worsening, can be referred back to Hematology. Discussed about weight loss and lifestyle modifications.   -She is okay for plasma donations from the hematology standpoint.  # Hypotension  - Patient took lasix 40 mg yesterday for bilateral  ankle swelling and had high urine output. Has not eaten anything today. Not hydrated much.  Her hypotension is likely related to dehydration.  She is asymptomatic.  I advised the patient to eat something and to keep herself well-hydrated today.  She should have her blood pressure checked over the weekend at the pharmacy.  If continues to be low, patient to reach out to PCP for further workup.  If she has concerning symptoms such as worsening shortness of breath, chest pain, dizziness, fevers, chills, weakness then she should report to the emergency room. Repeat BP check manually in the clinic improved to 130/70.   -I have advised the patient to discuss with PCP further about the Lasix dose.  20 mg as needed should be sufficient.   No orders of the defined types were placed in this encounter.  RTC PRN.  Patient expressed understanding and was in agreement with this plan. She also understands that She can call clinic at any time with any questions, concerns, or complaints.   I spent a total of 30 minutes reviewing chart data, face-to-face evaluation with the patient, counseling and coordination of care as detailed above.  Michaelyn Barter, MD   04/24/2023 1:55 PM

## 2023-04-24 NOTE — Progress Notes (Signed)
 Patient states that she had a house visit from [facility], and they told her that she needed to have a sleep study test done, so she was told she needed to call and make the appointment herself but the doctor would send over the referral. She is still having shortness of breath, and fatigue. She is now taking Ambien 5 mg but 2 at bedtime instead of just one. Her BP was 70/52 which is the lowest it has been, so she is worried about that as well.

## 2023-05-06 ENCOUNTER — Telehealth: Payer: Self-pay

## 2023-05-06 NOTE — Telephone Encounter (Signed)
 [facility] request for medical consultation paperwork completed and ready for pick up.  Patient notified.

## 2023-05-15 NOTE — Progress Notes (Unsigned)
 Sleep Medicine   Office Visit  Patient Name: Gabrielle Williams DOB: 25-Jun-1960 MRN [id number]    Chief Complaint: sleep evaluation  Brief History:  Jamilex presents for an initial consult for sleep evaluation and to establish care. Patient has a longstanding history of snoring and excessive daytime sleepiness. Sleep quality is poor. This is noted most night. The patient's bed partner reports  snoring, gasping, choking, and witnessed apneic pauses at night. The patient relates the following symptoms: some morning headaches, brain fog, forgetfulness, trouble concentrating and excessive fatigue are also present. The patient goes to sleep at 0900 pm and wakes up at 0600 am and will wake up several times in between with trouble returning to sleep.  Sleep quality is worse when outside home environment.  Patient has noted significant movement of her legs at night that would disrupt her sleep.  The patient  relates sleep walking, sleep talking, sleep paralysis, vivid dreaming and nightmares as unusual behavior during the night.  The patient relates depression as a history of psychiatric problems. The Epworth Sleepiness Score is 14 out of 24 .  The patient relates  Cardiovascular risk factors include: none.     ROS  General: (-) fever, (-) chills, (-) night sweat Nose and Sinuses: (-) nasal stuffiness or itchiness, (-) postnasal drip, (-) nosebleeds, (-) sinus trouble. Mouth and Throat: (-) sore throat, (-) hoarseness. Neck: (-) swollen glands, (-) enlarged thyroid, (-) neck pain. Respiratory: - cough, + shortness of breath, - wheezing. Neurologic: - numbness, - tingling. Psychiatric: + anxiety, + depression Sleep behavior: +sleep paralysis +hypnogogic hallucinations +dream enactment      +vivid dreams -cataplexy -night terrors +sleep walking   Current Medication: Outpatient Encounter Medications as of 05/18/2023  Medication Sig   atorvastatin (LIPITOR) 20 MG tablet Take 20 mg by mouth at bedtime.    Baclofen  5 MG TABS Take 5 mg by mouth 3 (three) times daily as needed.   diclofenac  (VOLTAREN ) 50 MG EC tablet Take 1 tablet (50 mg total) by mouth 2 (two) times daily.   erythromycin  ophthalmic ointment Place 1 Application into the left eye at bedtime.   escitalopram (LEXAPRO) 10 MG tablet Take by mouth.   fenofibrate 160 MG tablet Take 160 mg by mouth daily.   furosemide (LASIX) 40 MG tablet Take 40 mg by mouth daily.   ibuprofen  (ADVIL ) 600 MG tablet Take 1 tablet (600 mg total) by mouth every 6 (six) hours as needed.   lidocaine  (LIDODERM ) 5 % Place 1 patch onto the skin daily. Remove & Discard patch within 12 hours or as directed by MD   meclizine  (ANTIVERT ) 12.5 MG tablet Take 1 tablet (12.5 mg total) by mouth 3 (three) times daily as needed for dizziness or nausea.   metFORMIN (GLUCOPHAGE) 500 MG tablet Take 500 mg by mouth daily.   zolpidem (AMBIEN) 5 MG tablet Take 5 mg by mouth at bedtime as needed. Take 2 at bedtime.   No facility-administered encounter medications on file as of 05/18/2023.    Surgical History: Past Surgical History:  Procedure Laterality Date   CESAREAN SECTION     TUBAL LIGATION      Medical History: Past Medical History:  Diagnosis Date   Back pain    Depression    Diabetes mellitus without complication (HCC)    Sickle cell trait (HCC)     Family History: Non contributory to the present illness  Social History: Social History   Socioeconomic History   Marital status: Married  Spouse name: Not on file   Number of children: 5   Years of education: Not on file   Highest education level: Associate degree: occupational, technical, or vocational program  Occupational History   Not on file  Tobacco Use   Smoking status: Former    Current packs/day: 0.00    Types: Cigarettes    Quit date: 07/29/2009    Years since quitting: 13.8   Smokeless tobacco: Not on file  Vaping Use   Vaping status: Never Used  Substance and Sexual Activity   Alcohol  use: Yes    Comment: occasional beer   Drug use: No   Sexual activity: Not Currently    Birth control/protection: Surgical  Other Topics Concern   Not on file  Social History Narrative   ** Merged History Encounter **       Social Drivers of Health   Financial Resource Strain: Not on file  Food Insecurity: Food Insecurity Present (12/30/2022)   Hunger Vital Sign    Worried About Running Out of Food in the Last Year: Sometimes true    Ran Out of Food in the Last Year: Sometimes true  Transportation Needs: Unmet Transportation Needs (12/30/2022)   PRAPARE - Administrator, Civil Service (Medical): Yes    Lack of Transportation (Non-Medical): Yes  Physical Activity: Not on file  Stress: Not on file  Social Connections: Not on file  Intimate Partner Violence: Not At Risk (12/30/2022)   Humiliation, Afraid, Rape, and Kick questionnaire    Fear of Current or Ex-Partner: No    Emotionally Abused: No    Physically Abused: No    Sexually Abused: No    Vital Signs: Last menstrual period 01/19/2014. There is no height or weight on file to calculate BMI.   Examination: General Appearance: The patient is well-developed, well-nourished, and in no distress. Neck Circumference: 42 cm Skin: Gross inspection of skin unremarkable. Head: normocephalic, no gross deformities. Eyes: no gross deformities noted. ENT: ears appear grossly normal Neurologic: Alert and oriented. No involuntary movements.    STOP BANG RISK ASSESSMENT S (snore) Have you been told that you snore?     YES   T (tired) Are you often tired, fatigued, or sleepy during the day?   YES  O (obstruction) Do you stop breathing, choke, or gasp during sleep? YES   P (pressure) Do you have or are you being treated for high blood pressure? YES   B (BMI) Is your body index greater than 35 kg/m? YES   A (age) Are you 61 years old or older? YES   N (neck) Do you have a neck circumference greater than 16 inches?    YES   G (gender) Are you a female? NO   TOTAL STOP/BANG "YES" ANSWERS 7                                                               A STOP-Bang score of 2 or less is considered low risk, and a score of 5 or more is high risk for having either moderate or severe OSA. For people who score 3 or 4, doctors may need to perform further assessment to determine how likely they are to have OSA.  EPWORTH SLEEPINESS SCALE:  Scale:  (0)= no chance of dozing; (1)= slight chance of dozing; (2)= moderate chance of dozing; (3)= high chance of dozing  Chance  Situtation    Sitting and reading: 3    Watching TV: 3    Sitting Inactive in public: 2    As a passenger in car: 2      Lying down to rest: 3    Sitting and talking: 0    Sitting quielty after lunch: 1    In a car, stopped in traffic: 0   TOTAL SCORE:   14 out of 24    SLEEP STUDIES:  None   LABS: Recent Results (from the past 2160 hours)  Iron and TIBC([facility])     Status: None   Collection Time: 04/22/23 12:19 PM  Result Value Ref Range   Iron 63 28 - 170 ug/dL   TIBC 119 147 - 829 ug/dL   Saturation Ratios 17 10.4 - 31.8 %   UIBC 301 ug/dL    Comment: Performed at [facility], [address] [place], [place] [address]  Ferritin     Status: Abnormal   Collection Time: 04/22/23 12:19 PM  Result Value Ref Range   Ferritin 712 (H) 11 - 307 ng/mL    Comment: Performed at [facility], [address] [place], [place] [address]  CBC with Differential (Cancer Center Only)     Status: Abnormal   Collection Time: 04/22/23 12:19 PM  Result Value Ref Range   WBC Count 4.8 4.0 - 10.5 K/uL   RBC 4.12 3.87 - 5.11 MIL/uL   Hemoglobin 12.0 12.0 - 15.0 g/dL   HCT 84.6 (L) 96.2 - 95.2 %   MCV 85.0 80.0 - 100.0 fL   MCH 29.1 26.0 - 34.0 pg   MCHC 34.3 30.0 - 36.0 g/dL   RDW 84.1 32.4 - 40.1 %   Platelet Count 234 150 - 400 K/uL   nRBC 0.0 0.0 - 0.2 %   Neutrophils Relative % 60 %    Neutro Abs 2.9 1.7 - 7.7 K/uL   Lymphocytes Relative 26 %   Lymphs Abs 1.3 0.7 - 4.0 K/uL   Monocytes Relative 11 %   Monocytes Absolute 0.5 0.1 - 1.0 K/uL   Eosinophils Relative 2 %   Eosinophils Absolute 0.1 0.0 - 0.5 K/uL   Basophils Relative 0 %   Basophils Absolute 0.0 0.0 - 0.1 K/uL   Immature Granulocytes 1 %   Abs Immature Granulocytes 0.03 0.00 - 0.07 K/uL    Comment: Performed at [facility], [address] [place], [place] [address]    Radiology: MR LIVER W WO CONTRAST Result Date: 02/04/2023 CLINICAL DATA:  Liver lesion.  Steatohepatitis. EXAM: MRI ABDOMEN WITHOUT AND WITH CONTRAST TECHNIQUE: Multiplanar multisequence MR imaging of the abdomen was performed both before and after the administration of intravenous contrast. CONTRAST:  10mL GADAVIST  GADOBUTROL  1 MMOL/ML IV SOLN COMPARISON:  Ultrasound 01/08/2023. FINDINGS: Lower chest: No pleural effusion lung bases. Hepatobiliary: In segment 8 of the liver is a bright T2, low T1 nonenhancing focus consistent with benign simple cystic focus measuring up to 3.7 cm. This corresponds to the finding by ultrasound. Otherwise no aggressive space-occupying lesion or hypervascular lesion. No biliary ductal dilatation. Gallbladder is distended. Separate tiny focus seen in segment 4A on series 4, image 11 measuring 3 mm. Again no imaging follow-up of these cystic lesions. Patent portal vein. Pancreas: No mass, inflammatory changes, or other parenchymal  abnormality identified. Spleen:  Within normal limits in size and appearance. Adrenals/Urinary Tract: Adrenal glands are preserved. No enhancing renal mass or collecting system dilatation. Few parapelvic renal cysts as well as some tiny benign parenchymal cysts. Stomach/Bowel: Visualized portions within the abdomen are unremarkable. Vascular/Lymphatic: No pathologically enlarged lymph nodes identified. No abdominal aortic aneurysm demonstrated. Mild atherosclerotic changes along the aorta.  Other:  No ascites. Musculoskeletal: No suspicious bone lesions identified. IMPRESSION: Benign-appearing hepatic cysts including a dominant focus in segment 8 corresponding to the finding by ultrasound. No aggressive separate liver lesion. No ascites or splenomegaly. Electronically Signed   By: Adrianna Horde M.D.   On: 02/04/2023 15:19    No results found.  No results found.    Assessment and Plan: Patient Active Problem List   Diagnosis Date Noted   Hypotension 04/24/2023   Elevated ferritin 01/26/2023   Liver lesion 01/26/2023   1. Witnessed episode of apnea (Primary) Patient evaluation suggests high risk of sleep disordered breathing due to witnessed apnea, snoring, frequent awakening, gasping, brain fog, daytime sleepiness.   Suggest: PSG  to assess/treat the patient's sleep disordered breathing. The patient was also counselled on weight loss to optimize sleep health.   2. Hypersomnia  PLAN hypersomnia:  Patient evaluation suggests significant daytime hypersomnia.  The Epworth Sleepiness Score is elevated at 14 out of 24. Patient reports drowsy driving. The patient is at increased risk of  MVA due to sleepiness. She reports hypnogogic hallucinations, sleep paralysis which suggest narcolepsy. Symptoms date back to teen years. Recommend MSLT for evaluation. She will ask her prescriber if she can hold her escitalopram for two weeks. She will need to ensure 8 hours of sleep nightly x 2 weeks prior to study and complete a sleep diary.   General Counseling: I have discussed the findings of the evaluation and examination with Dina Francisco.  I have also discussed any further diagnostic evaluation thatmay be needed or ordered today. Kaizleigh verbalizes understanding of the findings of todays visit. We also reviewed her medications today and discussed drug interactions and side effects including but not limited excessive drowsiness and altered mental states. We also discussed that there is always a risk  not just to her but also people around her. she has been encouraged to call the office with any questions or concerns that should arise related to todays visit.  No orders of the defined types were placed in this encounter.       I have personally obtained a history, evaluated the patient, evaluated pertinent data, formulated the assessment and plan and placed orders.   This patient was seen today by Louann Rous, PA-C in collaboration with Dr. Cam Cava.   Cordie Deters, MD [facility] Diplomate ABMS Pulmonary and Critical Care Medicine Sleep medicine

## 2023-05-18 ENCOUNTER — Ambulatory Visit (INDEPENDENT_AMBULATORY_CARE_PROVIDER_SITE_OTHER): Admitting: Internal Medicine

## 2023-05-18 VITALS — BP 149/71 | HR 85 | Resp 16 | Ht 64.0 in | Wt 225.0 lb

## 2023-05-18 DIAGNOSIS — G4710 Hypersomnia, unspecified: Secondary | ICD-10-CM | POA: Insufficient documentation

## 2023-05-18 DIAGNOSIS — R0681 Apnea, not elsewhere classified: Secondary | ICD-10-CM | POA: Diagnosis not present

## 2023-08-11 ENCOUNTER — Other Ambulatory Visit: Payer: Self-pay | Admitting: Otolaryngology

## 2023-08-11 DIAGNOSIS — R1310 Dysphagia, unspecified: Secondary | ICD-10-CM

## 2023-08-12 ENCOUNTER — Ambulatory Visit
Admission: RE | Admit: 2023-08-12 | Discharge: 2023-08-12 | Disposition: A | Discharge status: Home / Self Care | Source: Ambulatory Visit | Attending: Otolaryngology | Admitting: Otolaryngology

## 2023-08-12 DIAGNOSIS — R1310 Dysphagia, unspecified: Secondary | ICD-10-CM | POA: Diagnosis present

## 2023-08-12 NOTE — Progress Notes (Addendum)
 Modified Barium Swallow Study  Patient Details  Name: Gabrielle Williams MRN: [id number] Date of Birth: [birth year]  Today's Date: 08/12/2023  Modified Barium Swallow completed.  Full report located under Chart Review in the Imaging Section.  History of Present Illness Pt is a 63 y.o. female with PMH including GERD on PPI (intermittently per pt report), chronic back pain, DM, prior heavy alcohol use and tobacco use per MD note, hyperlipidemia, bilateral lower extremity swelling, obesity per MD note, high risk of sleep disordered breathing due to witnessed apnea and hypersomnia.  Last CXR was 08/2022: Negative. No acute cardiopulmonary abnormality.. No report of/dx'd pneumonia. No report of weight loss nor changes in eating habits per pt.     Clinical Impression Patient presents today with functional oropharyngeal phase swallowing w/ No aspiration nor laryngeal penetration noted during this study. 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 ANY Esophageal phase deficits can impact the oropharyngeal phase of swallowing. Pt has REFLUX/GERD and is on a PPI, sometimes when I need it. She described a Globus sensation w/ solids(more) when at Home, pointing to the sternal notch area>mid-sternum area.    Oral phase is characterized by adequate lip closure, bolus preparation and containment, and anterior to posterior transit. Swallow initiation occurs primarily at the level of the BOT>valleculae w/ all trial consistencies.   Pharyngeal phase is noted for adequate tongue base retraction, adequate hyolaryngeal excursion, and adequate pharyngeal constriction. No laryngeal penetration nor aspriation occurred. Epiglottic inversion is complete w/ all trials during the study w/ timely/tight epiglottic inversion/closure. Pharyngeal stripping wave is complete. No pharyngeal residue remained post swallow w/ all trials.   Amplitude/duration of cricopharyngeus opening appeared [facility]. There was  adequate/complete clearance through the upper cervical Esophagus. OF NOTE: a min prominent CP Muscle(posterior wall) was noted DURING the swallow- this did not appear to impact bolus motility through the Cervical Esophagus.  An Esophageal sweep was performed in the standing position revealing min bolus stasis in the ~Thoracic Esophagus- this may also be related to the Esophageal phase Dysmotility pt describes.    Pt was educated on the results of the study immediately after; video viewed and questions answered. Factors that may increase risk of adverse event in presence of aspiration Noe & Lianne 2021): GI disease (GERD on PPI at home)   Swallow Evaluation Recommendations Recommendations: PO diet PO Diet Recommendation: Regular; Thin liquids (Level 0) (small-cut foods; moistened foods; monitor for problematic foods and report to GI/PCP) Liquid Administration via: Cup; less straw use d/t air swallowed Medication Administration: Whole meds with liquid ((no issues at Baseline per pt report)) Supervision: Patient able to self-feed Swallowing strategie : Minimize environmental distractions; Slow rate; Small bites/sips; Follow solids with liquids Postural changes: Position pt fully upright for meals; Stay upright 30-60 min after meals (GERD precautions Baseline) Oral care recommendations: Oral care BID (2x/day); Pt independent with oral care Recommended consults: Consider GI consultation; Consider esophageal assessment       Comer Portugal, MS, CCC-SLP Speech Language Pathologist Rehab Services; [facility] [place] [phone] (ascom) Tehya Leath 08/12/2023,5:35 PM

## 2023-10-12 ENCOUNTER — Telehealth: Payer: Self-pay

## 2023-10-12 NOTE — Telephone Encounter (Signed)
 Copied from CRM #[id number]. Topic: Appointments - Transfer of Care >> Oct 12, 2023 12:05 PM Darshell M wrote: Pt is requesting to transfer FROM: Rock Bosch Pt is requesting to transfer TO: Clarissa Zafirov Reason for requested transfer: Rock Bosch has retired. It is the responsibility of the team the patient would like to transfer to (Dr. Clarissa Zafirove) to reach out to the patient if for any reason this transfer is not acceptable.

## 2023-10-23 ENCOUNTER — Ambulatory Visit

## 2023-10-23 VITALS — BP 100/70 | HR 54 | Ht 64.0 in | Wt 227.2 lb

## 2023-10-23 DIAGNOSIS — Z1211 Encounter for screening for malignant neoplasm of colon: Secondary | ICD-10-CM

## 2023-10-23 DIAGNOSIS — K219 Gastro-esophageal reflux disease without esophagitis: Secondary | ICD-10-CM

## 2023-10-23 DIAGNOSIS — G4700 Insomnia, unspecified: Secondary | ICD-10-CM

## 2023-10-23 DIAGNOSIS — G4733 Obstructive sleep apnea (adult) (pediatric): Secondary | ICD-10-CM

## 2023-10-23 DIAGNOSIS — Z23 Encounter for immunization: Secondary | ICD-10-CM | POA: Diagnosis not present

## 2023-10-23 DIAGNOSIS — R011 Cardiac murmur, unspecified: Secondary | ICD-10-CM | POA: Diagnosis not present

## 2023-10-23 DIAGNOSIS — R609 Edema, unspecified: Secondary | ICD-10-CM | POA: Diagnosis not present

## 2023-10-23 DIAGNOSIS — K635 Polyp of colon: Secondary | ICD-10-CM

## 2023-10-23 DIAGNOSIS — Z1231 Encounter for screening mammogram for malignant neoplasm of breast: Secondary | ICD-10-CM

## 2023-10-23 DIAGNOSIS — Z800 Family history of malignant neoplasm of digestive organs: Secondary | ICD-10-CM

## 2023-10-23 MED ORDER — FUROSEMIDE 20 MG PO TABS: 20.0000 mg | ORAL_TABLET | Freq: Every day | ORAL | 0 refills | Status: DC

## 2023-10-23 MED ORDER — PANTOPRAZOLE SODIUM 40 MG PO TBEC: 40.0000 mg | DELAYED_RELEASE_TABLET | Freq: Every day | ORAL | 0 refills | Status: DC

## 2023-10-23 NOTE — Progress Notes (Signed)
 New Patient Visit   Physician: Daxson Reffett A Jahzion Brogden, MD  Patient: Gabrielle Williams   DOB: [birth year]   63 y.o. Female  MRN: [id number] Visit Date: 10/23/2023   Chief Complaint  Patient presents with   Establish Care   Subjective  Gabrielle Williams is a 63 y.o. female who presents today as a new patient to establish care.   HPI  Discussed the use of AI scribe software for clinical note transcription with the patient, who gave verbal consent to proceed.  History of Present Illness   Gabrielle Williams is a 63 year old female who presents for a new patient visit and medication review.  Peripheral edema - Ankle swelling is noticeable but not painful - Uses furosemide as needed for ankle swelling - No history of CHF - Blood pressure today is 100/70, which is lower than her usual - Occasional dizziness when standing up quickly  Hyperlipidemia - Takes fenofibrate and atorvastatin 20 mg  Prediabetes - Takes metformin but not daily - reports that DM dx not confirmed.  Does nt know last hgbA1c  Sleep disturbance and obstructive sleep apnea - Takes Ambien nightly for sleep, which is effective without causing grogginess - Diagnosed with obstructive sleep apnea a couple of months ago - Uses CPAP machine with improvement in symptoms  Anxiety and depressive symptoms - Takes Lexapro 10 mg for anxiety and mild depression for a few months - No significant difference in symptoms but believes it helps even out her mood  Musculoskeletal pain and limited mobility - Does not exercise regularly - Not working since a work accident involving a slip and splits, resulting in surgery - Experiences pain after walking for 10-15 minutes  History of pneumonia - Had pneumonia in the left lung last year, diagnosed in the emergency room  Gastroesophageal reflux symptoms - Takes pantoprazole 40 mg daily  Colorectal cancer risk and surveillance - Family history of colon cancer in father, mother, and  brother - Colonoscopy last year revealed polyps, which were removed - Due for next colonoscopy now - No genetic screening for colon cancer         ASSESSMENT & PLAN  Encounter Diagnoses  Name Primary?   Gastroesophageal reflux disease without esophagitis Yes   Cardiac murmur    OSA (obstructive sleep apnea)    Edema, unspecified type    Insomnia, unspecified type    FH: colon cancer    Polyp of colon, unspecified part of colon, unspecified type    Screening mammogram for breast cancer    Colon cancer screening     Orders Placed This Encounter  Procedures   MM 3D SCREENING MAMMOGRAM BILATERAL BREAST   CBC with Differential/Platelet   Comprehensive metabolic panel with GFR   Hemoglobin A1c   Lipid panel   Urinalysis, Routine w reflex microscopic   Lipoprotein A (LPA)   Ambulatory referral to Gastroenterology   Ambulatory referral to Genetics   ECHOCARDIOGRAM COMPLETE    Assessment and Plan    Lower extremity edema Chronic edema. Managed with furosemide, but not taken daily. Low blood pressure possibly from furosemide. - Prescribe furosemide 20 mg as needed for edema. - Monitor blood pressure regularly.  Chronic pain after lower extremity injury and surgery Chronic pain limits activity, exacerbated by walking. Discussed importance of mobility and strength. - Encourage low-impact exercises such as stationary biking.  Heart murmur New heart murmur, possibly valvular insufficiency. No prior evaluation or echocardiogram. - Order echocardiogram to evaluate heart murmur.  Obstructive sleep apnea Managed with CPAP, reports symptom improvement and no snoring. Discussed cardiac risk if untreated.  Hyperlipidemia Chronic hyperlipidemia managed with fenofibrate. No recent lipid panel. - Order lipid panel as part of routine labs.  Prediabetes Prediabetes, previously managed with metformin. No recent hemoglobin A1c. - Order hemoglobin A1c as part of routine  labs.  Gastroesophageal reflux disease (GERD) Chronic GERD, previously managed with pantoprazole. Requests refill. - Prescribe pantoprazole 40 mg for GERD.  Depression and anxiety Anxiety and mild depression managed with Lexapro.  Family history of colon cancer Significant family history. Discussed genetic screening and early screening for children. - Order genetic screening for colon cancer. - Discuss early screening for children.      Obesity - BMI of 39.  Notes diet could be improved.   Mammogram ordered today Influenza vaccine given   F/u in 4 weeks    Objective  BP 100/70   Pulse (!) 54   Ht 5' 4 (1.626 m)   Wt 227 lb 3.2 oz (103.1 kg)   LMP 01/19/2014   SpO2 98%   BMI 39.00 kg/m      Review of Systems  Constitutional:  Negative for chills, fever and weight loss.  Eyes:  Negative for blurred vision. h Respiratory:  Negative for cough and shortness of breath.   Cardiovascular:  Negative for chest pain and palpitations.  Skin:  Negative for rash.  Psychiatric/Behavioral:  Negative for depression. The patient is not nervous/anxious.      Physical Exam Physical Exam Vitals reviewed.  Constitutional:      Appearance: Normal appearance. Well-developed with normal weight.  HENT:     Head: Normocephalic and atraumatic.  Normal mucous membranes, no oral lesions Eyes:     Pupils: Pupils are equal, round, and reactive to light.  Neck:     Thyroid: No thyroid mass or thyromegaly.  Cardiovascular:     Rate and Rhythm: Normal rate and regular rhythm. Normal heart sounds. Normal peripheral pulses Pulmonary:     Normal breath sounds with normal effort Abdominal:   Abdomen is soft, without tenderness or noted hepatosplenomegaly Musculoskeletal:        General: No swelling or edema  Lymphadenopathy:     Cervical: No cervical adenopathy.  Skin:    General: Skin is warm and dry without noticeable rash. Neurological:     General: No focal deficit present.   Psychiatric:        Mood and Affect: Mood, behavior and cognition normal   Past Medical History:  Diagnosis Date   Back pain    Depression    Diabetes mellitus without complication (HCC)    Sickle cell trait    Past Surgical History:  Procedure Laterality Date   CESAREAN SECTION     TUBAL LIGATION     Family Status  Relation Name Status   Mother  Deceased   Father  (Not Specified)   Brother  (Not Specified)  No partnership data on file   Family History  Problem Relation Age of Onset   Breast cancer Mother 95   Colon cancer Father    Lung cancer Brother    Social History   Socioeconomic History   Marital status: Married    Spouse name: Not on file   Number of children: 5   Years of education: Not on file   Highest education level: Associate degree: occupational, Scientist, product/process development, or vocational program  Occupational History   Not on file  Tobacco Use   Smoking status:  Former    Current packs/day: 0.00    Types: Cigarettes    Quit date: 07/29/2009    Years since quitting: 14.2   Smokeless tobacco: Not on file  Vaping Use   Vaping status: Never Used  Substance and Sexual Activity   Alcohol use: Yes    Comment: occasional beer   Drug use: No   Sexual activity: Not Currently    Birth control/protection: Surgical  Other Topics Concern   Not on file  Social History Narrative   ** Merged History Encounter **       Social Drivers of Health   Financial Resource Strain: Not on file  Food Insecurity: Food Insecurity Present (12/30/2022)   Hunger Vital Sign    Worried About Running Out of Food in the Last Year: Sometimes true    Ran Out of Food in the Last Year: Sometimes true  Transportation Needs: Unmet Transportation Needs (12/30/2022)   PRAPARE - Administrator, Civil Service (Medical): Yes    Lack of Transportation (Non-Medical): Yes  Physical Activity: Not on file  Stress: Not on file  Social Connections: Not on file   Outpatient Medications  Prior to Visit  Medication Sig   atorvastatin (LIPITOR) 20 MG tablet Take 20 mg by mouth at bedtime.   escitalopram (LEXAPRO) 10 MG tablet Take by mouth.   fenofibrate 160 MG tablet Take 160 mg by mouth daily.   fluticasone (FLONASE) 50 MCG/ACT nasal spray Place 2 sprays into both nostrils daily.   furosemide (LASIX) 40 MG tablet Take 40 mg by mouth daily.   ibuprofen  (ADVIL ) 600 MG tablet Take 1 tablet (600 mg total) by mouth every 6 (six) hours as needed.   metFORMIN (GLUCOPHAGE) 500 MG tablet Take 500 mg by mouth daily.   zolpidem (AMBIEN) 5 MG tablet Take 5 mg by mouth at bedtime as needed. Take 2 at bedtime.   [DISCONTINUED] pantoprazole (PROTONIX) 40 MG tablet Take 40 mg by mouth daily.   [DISCONTINUED] diclofenac  (VOLTAREN ) 50 MG EC tablet Take 1 tablet (50 mg total) by mouth 2 (two) times daily.   [DISCONTINUED] erythromycin  ophthalmic ointment Place 1 Application into the left eye at bedtime.   [DISCONTINUED] lidocaine  (LIDODERM ) 5 % Place 1 patch onto the skin daily. Remove & Discard patch within 12 hours or as directed by MD   [DISCONTINUED] meclizine  (ANTIVERT ) 12.5 MG tablet Take 1 tablet (12.5 mg total) by mouth 3 (three) times daily as needed for dizziness or nausea.   No facility-administered medications prior to visit.   No Known Allergies  Immunization History  Administered Date(s) Administered   Influenza,inj,Quad PF,6+ Mos 12/29/2018, 11/25/2021   Influenza,inj,quad, With Preservative 12/14/2018   Td (Adult),5 Lf Tetanus Toxid, Preservative Free 12/29/2018   Zoster Recombinant(Shingrix) 12/01/2022    Health Maintenance  Topic Date Due   Medicare Annual Wellness (AWV)  Never done   HIV Screening  Never done   Hepatitis C Screening  Never done   Pneumococcal Vaccine: 50+ Years (1 of 2 - PCV) Never done   DTaP/Tdap/Td (1 - Tdap) 12/30/2018   Zoster Vaccines- Shingrix (2 of 2) 01/26/2023   Influenza Vaccine  08/14/2023   COVID-19 Vaccine (1 - 2025-26 season) Never  done   Mammogram  07/23/2024   Cervical Cancer Screening (HPV/Pap Cotest)  02/05/2025   Colonoscopy  09/17/2032   Hepatitis B Vaccines 19-59 Average Risk  Aged Out   HPV VACCINES  Aged Out   Meningococcal B Vaccine  Aged Out  Patient Care Team: Everlene Parris LABOR, MD as PCP - General (Family Medicine) Brannock, Wanda SQUIBB, RN Clista Bimler, MD as Consulting Physician (Oncology)  Depression Screen    12/30/2022   12:29 PM  PHQ 2/9 Scores  PHQ - 2 Score 2     Parris LABOR Everlene, MD  [facility] [phone] (phone) [phone] (fax)  [facility]

## 2023-11-10 ENCOUNTER — Other Ambulatory Visit

## 2023-11-11 ENCOUNTER — Other Ambulatory Visit

## 2023-11-12 ENCOUNTER — Other Ambulatory Visit: Payer: Self-pay

## 2023-11-12 NOTE — Addendum Note (Signed)
 Addended by: Monisha Siebel K on: 11/12/2023 12:17 PM   Modules accepted: Orders

## 2023-11-12 NOTE — Telephone Encounter (Unsigned)
 Copied from CRM #[id number]. Topic: Clinical - Medication Refill >> Nov 12, 2023 12:08 PM Teressa P wrote: Medication: Metformin 500 mg 100 day supply per insurance  Has the patient contacted their pharmacy? Yes (Agent: If no, request that the patient contact the pharmacy for the refill. If patient does not wish to contact the pharmacy document the reason why and proceed with request.) (Agent: If yes, when and what did the pharmacy advise?)  This is the patient's preferred pharmacy:  [id number] [place], [place] - [address] AT [facility] OF [address] [place] [address] Phone: [phone] Fax: [phone]  Is this the correct pharmacy for this prescription? Yes If no, delete pharmacy and type the correct one.   Has the prescription been filled recently? Yes  Is the patient out of the medication? Yes  Has the patient been seen for an appointment in the last year OR does the patient have an upcoming appointment? Yes  Can we respond through MyChart? No  Agent: Please be advised that Rx refills may take up to 3 business days. We ask that you follow-up with your pharmacy.

## 2023-11-12 NOTE — Telephone Encounter (Signed)
 Copied from CRM #[id number]. Topic: Clinical - Medical Advice >> Nov 12, 2023 10:09 AM Gabrielle Williams wrote: Reason for CRM: Pt said she was referred to GI office for a procedure, but doesn't remember the specific procedure she's supposed to be having. GI office called pt because they are also unsure of what she needs. Pls call pt.

## 2023-11-13 MED ORDER — METFORMIN HCL 500 MG PO TABS: 500.0000 mg | ORAL_TABLET | Freq: Every day | ORAL | 1 refills | Status: DC

## 2023-11-13 NOTE — Telephone Encounter (Signed)
 Requested medication (s) are due for refill today: routing for review  Requested medication (s) are on the active medication list: no  Last refill:  08/20/21  Future visit scheduled: yes  Notes to clinic:  historical medication     Requested Prescriptions  Pending Prescriptions Disp Refills   metFORMIN (GLUCOPHAGE) 500 MG tablet      Sig: Take 1 tablet (500 mg total) by mouth daily.     Endocrinology:  Diabetes - Biguanides Failed - 11/13/2023  3:57 PM      Failed - Cr in normal range and within 360 days    Creatinine, Ser  Date Value Ref Range Status  12/30/2022 1.06 (H) 0.44 - 1.00 mg/dL Final         Failed - HBA1C is between 0 and 7.9 and within 180 days    No results found for: HGBA1C, LABA1C       Failed - B12 Level in normal range and within 720 days    No results found for: VITAMINB12       Passed - eGFR in normal range and within 360 days    GFR calc Af Amer  Date Value Ref Range Status  07/20/2014 >60 >60 mL/min Final    Comment:    (NOTE) The eGFR has been calculated using the CKD EPI equation. This calculation has not been validated in all clinical situations. eGFR's persistently <60 mL/min signify possible Chronic Kidney Disease.    GFR, Estimated  Date Value Ref Range Status  12/30/2022 >60 >60 mL/min Final    Comment:    (NOTE) Calculated using the CKD-EPI Creatinine Equation (2021)          Passed - Valid encounter within last 6 months    Recent Outpatient Visits           3 weeks ago Flu vaccine need   [facility], Parris LABOR, MD              Passed - CBC within normal limits and completed in the last 12 months    WBC  Date Value Ref Range Status  12/30/2022 5.4 4.0 - 10.5 K/uL Final   WBC Count  Date Value Ref Range Status  04/22/2023 4.8 4.0 - 10.5 K/uL Final   RBC  Date Value Ref Range Status  04/22/2023 4.12 3.87 - 5.11 MIL/uL Final   Hemoglobin  Date Value Ref Range Status   04/22/2023 12.0 12.0 - 15.0 g/dL Final   HCT  Date Value Ref Range Status  04/22/2023 35.0 (L) 36.0 - 46.0 % Final   MCHC  Date Value Ref Range Status  04/22/2023 34.3 30.0 - 36.0 g/dL Final   [facility]  Date Value Ref Range Status  04/22/2023 29.1 26.0 - 34.0 pg Final   MCV  Date Value Ref Range Status  04/22/2023 85.0 80.0 - 100.0 fL Final   No results found for: PLTCOUNTKUC, LABPLAT, POCPLA RDW  Date Value Ref Range Status  04/22/2023 12.9 11.5 - 15.5 % Final

## 2023-11-14 LAB — LIPOPROTEIN A (LPA): Lipoprotein (a): 295 nmol/L — ABNORMAL HIGH (ref <75)

## 2023-11-18 ENCOUNTER — Other Ambulatory Visit: Payer: Self-pay

## 2023-11-18 ENCOUNTER — Ambulatory Visit (INDEPENDENT_AMBULATORY_CARE_PROVIDER_SITE_OTHER)

## 2023-11-18 VITALS — BP 114/45 | HR 55 | Ht 64.0 in | Wt 226.2 lb

## 2023-11-18 DIAGNOSIS — K635 Polyp of colon: Secondary | ICD-10-CM

## 2023-11-18 DIAGNOSIS — G4700 Insomnia, unspecified: Secondary | ICD-10-CM

## 2023-11-18 DIAGNOSIS — E7841 Elevated Lipoprotein(a): Secondary | ICD-10-CM | POA: Diagnosis not present

## 2023-11-18 DIAGNOSIS — Z1231 Encounter for screening mammogram for malignant neoplasm of breast: Secondary | ICD-10-CM

## 2023-11-18 DIAGNOSIS — E782 Mixed hyperlipidemia: Secondary | ICD-10-CM

## 2023-11-18 DIAGNOSIS — R7989 Other specified abnormal findings of blood chemistry: Secondary | ICD-10-CM | POA: Diagnosis not present

## 2023-11-18 DIAGNOSIS — Z800 Family history of malignant neoplasm of digestive organs: Secondary | ICD-10-CM

## 2023-11-18 DIAGNOSIS — G4733 Obstructive sleep apnea (adult) (pediatric): Secondary | ICD-10-CM | POA: Diagnosis not present

## 2023-11-18 DIAGNOSIS — R011 Cardiac murmur, unspecified: Secondary | ICD-10-CM

## 2023-11-18 DIAGNOSIS — R609 Edema, unspecified: Secondary | ICD-10-CM

## 2023-11-18 DIAGNOSIS — Z1211 Encounter for screening for malignant neoplasm of colon: Secondary | ICD-10-CM

## 2023-11-18 DIAGNOSIS — K219 Gastro-esophageal reflux disease without esophagitis: Secondary | ICD-10-CM

## 2023-11-18 NOTE — Progress Notes (Signed)
 Progress Note  Physician: Jessalyn Hinojosa A Janessa Mickle, MD   HPI: Gabrielle Williams is a 63 y.o. female presenting on 11/18/2023 for Follow-up (Patient is concerned about her medication) .  Discussed the use of AI scribe software for clinical note transcription with the patient, who gave verbal consent to proceed.  History of Present Illness   Gabrielle Williams is a 63 year old female with high lipoprotein A and sleep apnea who presents for evaluation of her lab results and management of her cardiovascular risk.  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  She does have OSA and is obese - Currently taking atorvastatin  Sleep apnea and insomnia - Diagnosed with sleep apnea - Initiated CPAP therapy approximately one month ago - Currently taking zolpidem for sleep for the past couple of years - No issues with sleepwalking or daytime grogginess - Recently filled a prescription for 60 zolpidem tablets  Laboratory evaluation and iron studies - Recent lab work incomplete; only one tube of blood drawn instead of three - History of elevated ferritin levels, attributed to iron supplementation for low hematocrit as a regular plasma donor ?      This needs f/u labs but they were missed on the draw   Medical history:  Relevant past medical, surgical, family and social history reviewed and updated as indicated. Interim medical history since our last visit reviewed.  Allergies and medications reviewed and  updated.   ROS: Negative unless specifically indicated above in HPI.    Current Outpatient Medications:    atorvastatin (LIPITOR) 20 MG tablet, Take 20 mg by mouth at bedtime., Disp: , Rfl:    escitalopram (LEXAPRO) 10 MG tablet, Take by mouth., Disp: , Rfl:    fenofibrate 160 MG tablet, Take 160 mg by mouth daily., Disp: , Rfl:    fluticasone (FLONASE) 50 MCG/ACT nasal spray, Place 2 sprays into both nostrils daily., Disp: , Rfl:    furosemide (LASIX) 20 MG tablet, Take 1 tablet (20 mg total) by mouth daily., Disp: 90 tablet, Rfl: 0   ibuprofen  (ADVIL ) 600 MG tablet, Take 1 tablet (600 mg total) by mouth every 6 (six) hours as needed., Disp: 30 tablet, Rfl: 0   metFORMIN (GLUCOPHAGE) 500 MG tablet, Take 1 tablet (500 mg total) by mouth daily., Disp: 90 tablet, Rfl: 1   pantoprazole (PROTONIX) 40 MG tablet, Take 1 tablet (40 mg total) by mouth daily., Disp: 90 tablet, Rfl: 0   zolpidem (AMBIEN) 5 MG tablet, Take  5 mg by mouth at bedtime as needed. Take 2 at bedtime., Disp: , Rfl:        Objective:     BP (!) 114/45   Pulse (!) 55   Ht 5' 4 (1.626 m)   Wt 226 lb 3.2 oz (102.6 kg)   LMP 01/19/2014   SpO2 96%   BMI 38.83 kg/m   Wt Readings from Last 3 Encounters:  11/18/23 226 lb 3.2 oz (102.6 kg)  10/23/23 227 lb 3.2 oz (103.1 kg)  05/18/23 225 lb (102.1 kg)    Physical Exam  Physical Exam Vitals reviewed.  Constitutional:      Appearance: Normal appearance. Well-developed with normal weight.  Cardiovascular:     Rate and Rhythm: Normal rate and regular rhythm. Normal heart sounds. Normal peripheral pulses Pulmonary:     Normal breath sounds with normal effort Skin:    General: Skin is warm and dry without noticeable rash. Neurological:     General: No focal deficit present.  Psychiatric:        Mood and Affect: Mood, behavior and cognition normal      Assessment & Plan:   Encounter Diagnoses  Name Primary?   Elevated lipoprotein(a) Yes   Elevated ferritin     Mixed hyperlipidemia    OSA (obstructive sleep apnea)     No orders of the defined types were placed in this encounter.    Assessment and Plan    Elevated lipoprotein(a) and cardiovascular risk management   Compounded risk by her history of hyperlipidemia and obstructive sleep apnea.  Order a coronary calcium score to assess coronary plaque burden. Continue atorvastatin therapy and initiate daily aspirin 81 mg. Provide an educational handout on lipoprotein(a)   Obstructive sleep apnea   Obstructive sleep apnea contributes to increased cardiovascular risk. She is currently using CPAP therapy. Encourage continued use of CPAP therapy.  Elevated ferritin and evaluation of iron status   Elevated ferritin may be related to iron supplementation, requiring further evaluation. Reorder comprehensive lab work to evaluate iron status.  Insomnia   Chronic insomnia is managed with zolpidem - she did receive from other provider on Nov 3rd by PDMP review.    Will f/u with her once calcium score complete

## 2023-11-19 LAB — URINALYSIS, ROUTINE W REFLEX MICROSCOPIC
Bacteria, UA: NONE SEEN /HPF
Bilirubin Urine: NEGATIVE
Glucose, UA: NEGATIVE
Hgb urine dipstick: NEGATIVE
Hyaline Cast: NONE SEEN /LPF
Ketones, ur: NEGATIVE
Nitrite: NEGATIVE
Protein, ur: NEGATIVE
RBC / HPF: NONE SEEN /HPF (ref 0–2)
Specific Gravity, Urine: 1.013 (ref 1.001–1.035)
pH: 5.5 (ref 5.0–8.0)

## 2023-11-19 LAB — CBC WITH DIFFERENTIAL/PLATELET
Absolute Lymphocytes: 1061 {cells}/uL (ref 850–3900)
Absolute Monocytes: 398 {cells}/uL (ref 200–950)
Basophils Absolute: 19 {cells}/uL (ref 0–200)
Basophils Relative: 0.4 %
Eosinophils Absolute: 120 {cells}/uL (ref 15–500)
Eosinophils Relative: 2.5 %
HCT: 35.2 % (ref 35.0–45.0)
Hemoglobin: 11.6 g/dL — ABNORMAL LOW (ref 11.7–15.5)
MCH: 29.3 pg (ref 27.0–33.0)
MCHC: 33 g/dL (ref 32.0–36.0)
MCV: 88.9 fL (ref 80.0–100.0)
MPV: 10.3 fL (ref 7.5–12.5)
Monocytes Relative: 8.3 %
Neutro Abs: 3202 {cells}/uL (ref 1500–7800)
Neutrophils Relative %: 66.7 %
Platelets: 230 Thousand/uL (ref 140–400)
RBC: 3.96 Million/uL (ref 3.80–5.10)
RDW: 13.2 % (ref 11.0–15.0)
Total Lymphocyte: 22.1 %
WBC: 4.8 Thousand/uL (ref 3.8–10.8)

## 2023-11-19 LAB — COMPREHENSIVE METABOLIC PANEL WITH GFR
AG Ratio: 2.2 (calc) (ref 1.0–2.5)
ALT: 20 U/L (ref 6–29)
AST: 14 U/L (ref 10–35)
Albumin: 4.3 g/dL (ref 3.6–5.1)
Alkaline phosphatase (APISO): 79 U/L (ref 37–153)
BUN: 17 mg/dL (ref 7–25)
CO2: 27 mmol/L (ref 20–32)
Calcium: 8.3 mg/dL — ABNORMAL LOW (ref 8.6–10.4)
Chloride: 109 mmol/L (ref 98–110)
Creat: 0.7 mg/dL (ref 0.50–1.05)
Globulin: 2 g/dL (ref 1.9–3.7)
Glucose, Bld: 137 mg/dL — ABNORMAL HIGH (ref 65–99)
Potassium: 4.4 mmol/L (ref 3.5–5.3)
Sodium: 142 mmol/L (ref 135–146)
Total Bilirubin: 0.3 mg/dL (ref 0.2–1.2)
Total Protein: 6.3 g/dL (ref 6.1–8.1)
eGFR: 98 mL/min/1.73m2 (ref >=60)

## 2023-11-19 LAB — LIPID PANEL
Cholesterol: 150 mg/dL (ref <200)
HDL: 49 mg/dL — ABNORMAL LOW (ref >=50)
LDL Cholesterol (Calc): 63 mg/dL
Non-HDL Cholesterol (Calc): 101 mg/dL (ref <130)
Total CHOL/HDL Ratio: 3.1 (calc) (ref <5.0)
Triglycerides: 288 mg/dL — ABNORMAL HIGH (ref <150)

## 2023-11-19 LAB — HEMOGLOBIN A1C
Hgb A1c MFr Bld: 6.3 % — ABNORMAL HIGH (ref <5.7)
Mean Plasma Glucose: 134 mg/dL
eAG (mmol/L): 7.4 mmol/L

## 2023-11-19 LAB — MICROSCOPIC MESSAGE

## 2023-11-20 ENCOUNTER — Other Ambulatory Visit: Payer: Self-pay

## 2023-11-20 DIAGNOSIS — R609 Edema, unspecified: Secondary | ICD-10-CM

## 2023-11-20 MED ORDER — ESCITALOPRAM OXALATE 10 MG PO TABS: 10.0000 mg | ORAL_TABLET | Freq: Every day | ORAL | 0 refills | Status: AC

## 2023-11-20 MED ORDER — FUROSEMIDE 20 MG PO TABS: 20.0000 mg | ORAL_TABLET | Freq: Every day | ORAL | 0 refills | Status: AC

## 2023-11-20 MED ORDER — FENOFIBRATE 160 MG PO TABS
160.0000 mg | ORAL_TABLET | Freq: Every day | ORAL | 0 refills | Status: AC
Start: 2023-11-20 — End: ?

## 2023-11-20 MED ORDER — ATORVASTATIN CALCIUM 20 MG PO TABS: 20.0000 mg | ORAL_TABLET | Freq: Every day | ORAL | 0 refills | Status: AC

## 2023-11-20 MED ORDER — METFORMIN HCL 500 MG PO TABS
500.0000 mg | ORAL_TABLET | Freq: Every day | ORAL | 1 refills | Status: AC
Start: 2023-11-20 — End: ?

## 2023-11-30 ENCOUNTER — Other Ambulatory Visit: Payer: Self-pay

## 2023-11-30 DIAGNOSIS — Z800 Family history of malignant neoplasm of digestive organs: Secondary | ICD-10-CM

## 2023-11-30 DIAGNOSIS — K219 Gastro-esophageal reflux disease without esophagitis: Secondary | ICD-10-CM

## 2023-11-30 DIAGNOSIS — R609 Edema, unspecified: Secondary | ICD-10-CM

## 2023-11-30 DIAGNOSIS — G4733 Obstructive sleep apnea (adult) (pediatric): Secondary | ICD-10-CM

## 2023-11-30 DIAGNOSIS — K635 Polyp of colon: Secondary | ICD-10-CM

## 2023-11-30 DIAGNOSIS — G4700 Insomnia, unspecified: Secondary | ICD-10-CM

## 2023-11-30 DIAGNOSIS — R011 Cardiac murmur, unspecified: Secondary | ICD-10-CM

## 2023-11-30 DIAGNOSIS — Z1231 Encounter for screening mammogram for malignant neoplasm of breast: Secondary | ICD-10-CM

## 2023-11-30 DIAGNOSIS — Z23 Encounter for immunization: Secondary | ICD-10-CM

## 2023-11-30 DIAGNOSIS — Z1211 Encounter for screening for malignant neoplasm of colon: Secondary | ICD-10-CM

## 2023-12-04 ENCOUNTER — Ambulatory Visit

## 2023-12-04 DIAGNOSIS — R609 Edema, unspecified: Secondary | ICD-10-CM | POA: Diagnosis not present

## 2023-12-04 DIAGNOSIS — Z800 Family history of malignant neoplasm of digestive organs: Secondary | ICD-10-CM

## 2023-12-04 DIAGNOSIS — G4733 Obstructive sleep apnea (adult) (pediatric): Secondary | ICD-10-CM | POA: Diagnosis not present

## 2023-12-04 DIAGNOSIS — G4700 Insomnia, unspecified: Secondary | ICD-10-CM

## 2023-12-04 DIAGNOSIS — K219 Gastro-esophageal reflux disease without esophagitis: Secondary | ICD-10-CM

## 2023-12-04 DIAGNOSIS — R011 Cardiac murmur, unspecified: Secondary | ICD-10-CM | POA: Diagnosis not present

## 2023-12-04 DIAGNOSIS — K635 Polyp of colon: Secondary | ICD-10-CM

## 2023-12-04 DIAGNOSIS — Z1231 Encounter for screening mammogram for malignant neoplasm of breast: Secondary | ICD-10-CM

## 2023-12-04 DIAGNOSIS — Z1211 Encounter for screening for malignant neoplasm of colon: Secondary | ICD-10-CM

## 2023-12-04 LAB — ECHOCARDIOGRAM COMPLETE
AV Mean grad: 5 mmHg
AV Peak grad: 10.1 mmHg
Ao pk vel: 1.59 m/s
Area-P 1/2: 3.48 cm2
S' Lateral: 2.9 cm

## 2023-12-04 NOTE — Progress Notes (Unsigned)
 [facility] [address] [place], [place] [address]  Pulmonary Sleep Medicine   Office Visit Note  Patient Name: Gabrielle Williams DOB: 1960-05-01 MRN [id number]    Chief Complaint: Obstructive Sleep Apnea visit  Brief History:  Gabrielle Williams is seen today for a follow up visit for CPAP@ 14 cmH2O. The patient has a 5 month history of sleep apnea. Patient is using PAP nightly.  The patient feels rested after sleeping with PAP.  The patient reports benefiting from PAP use. Reported sleepiness is  improved and the Epworth Sleepiness Score is 11 out of 24. The patient will occasionally take naps. The patient complains of the following: pt has been experiencing headaches for the last 2 weeks.  The compliance download shows 73% compliance with an average use time of 5 hours 47 minutes. The AHI is 0.8.  The patient does complain of limb movements disrupting sleep. The patient continues to require PAP therapy in order to eliminate sleep apnea.   ROS  General: (-) fever, (-) chills, (-) night sweat Nose and Sinuses: (-) nasal stuffiness or itchiness, (-) postnasal drip, (-) nosebleeds, (-) sinus trouble. Mouth and Throat: (-) sore throat, (-) hoarseness. Neck: (-) swollen glands, (-) enlarged thyroid, (-) neck pain. Respiratory: - cough, - shortness of breath, - wheezing. Neurologic: - numbness, - tingling. Psychiatric: - anxiety, - depression   Current Medication: Outpatient Encounter Medications as of 12/07/2023  Medication Sig   aspirin EC 81 MG tablet Take 81 mg by mouth daily. Swallow whole.   atorvastatin  (LIPITOR) 20 MG tablet Take 1 tablet (20 mg total) by mouth at bedtime.   escitalopram  (LEXAPRO ) 10 MG tablet Take 1 tablet (10 mg total) by mouth daily.   fenofibrate  160 MG tablet Take 1 tablet (160 mg total) by mouth daily.   fluticasone (FLONASE) 50 MCG/ACT nasal spray Place 2 sprays into both nostrils daily.   furosemide  (LASIX ) 20 MG tablet Take 1 tablet (20 mg total) by  mouth daily.   ibuprofen  (ADVIL ) 600 MG tablet Take 1 tablet (600 mg total) by mouth every 6 (six) hours as needed.   metFORMIN  (GLUCOPHAGE ) 500 MG tablet Take 1 tablet (500 mg total) by mouth daily.   pantoprazole  (PROTONIX ) 40 MG tablet Take 1 tablet (40 mg total) by mouth daily.   zolpidem (AMBIEN) 5 MG tablet Take 5 mg by mouth at bedtime as needed. Take 2 at bedtime.   No facility-administered encounter medications on file as of 12/07/2023.    Surgical History: Past Surgical History:  Procedure Laterality Date   CESAREAN SECTION     TUBAL LIGATION      Medical History: Past Medical History:  Diagnosis Date   Anemia    Anxiety    Back pain    Diabetes mellitus without complication (HCC)    High cholesterol    Sickle cell trait    Sleep apnea     Family History: Non contributory to the present illness  Social History: Social History   Socioeconomic History   Marital status: Married    Spouse name: Not on file   Number of children: 5   Years of education: Not on file   Highest education level: Associate degree: occupational, scientist, product/process development, or vocational program  Occupational History   Not on file  Tobacco Use   Smoking status: Former    Current packs/day: 0.00    Types: Cigarettes    Quit date: 07/29/2009    Years since quitting: 14.3   Smokeless tobacco:  Not on file  Vaping Use   Vaping status: Never Used  Substance and Sexual Activity   Alcohol use: Not Currently    Comment: occasional beer   Drug use: No   Sexual activity: Not Currently    Birth control/protection: Surgical  Other Topics Concern   Not on file  Social History Narrative   ** Merged History Encounter **       Social Drivers of Health   Financial Resource Strain: Not on file  Food Insecurity: Food Insecurity Present (12/30/2022)   Hunger Vital Sign    Worried About Running Out of Food in the Last Year: Sometimes true    Ran Out of Food in the Last Year: Sometimes true  Transportation  Needs: Unmet Transportation Needs (12/30/2022)   PRAPARE - Administrator, Civil Service (Medical): Yes    Lack of Transportation (Non-Medical): Yes  Physical Activity: Not on file  Stress: Not on file  Social Connections: Not on file  Intimate Partner Violence: Not At Risk (12/30/2022)   Humiliation, Afraid, Rape, and Kick questionnaire    Fear of Current or Ex-Partner: No    Emotionally Abused: No    Physically Abused: No    Sexually Abused: No    Vital Signs: Blood pressure (!) 150/82, pulse 61, resp. rate 16, height 5' 4 (1.626 m), weight 225 lb (102.1 kg), last menstrual period 01/19/2014, SpO2 99%. Body mass index is 38.62 kg/m.    Examination: General Appearance: The patient is well-developed, well-nourished, and in no distress. Neck Circumference: 42 cm Skin: Gross inspection of skin unremarkable. Head: normocephalic, no gross deformities. Eyes: no gross deformities noted. ENT: ears appear grossly normal Neurologic: Alert and oriented. No involuntary movements.  STOP BANG RISK ASSESSMENT S (snore) Have you been told that you snore?     NO   T (tired) Are you often tired, fatigued, or sleepy during the day?   NO  O (obstruction) Do you stop breathing, choke, or gasp during sleep? NO   P (pressure) Do you have or are you being treated for high blood pressure? NO   B (BMI) Is your body index greater than 35 kg/m? YES   A (age) Are you 39 years old or older? YES   N (neck) Do you have a neck circumference greater than 16 inches?   YES   G (gender) Are you a female? NO   TOTAL STOP/BANG "YES" ANSWERS 4       A STOP-Bang score of 2 or less is considered low risk, and a score of 5 or more is high risk for having either moderate or severe OSA. For people who score 3 or 4, doctors may need to perform further assessment to determine how likely they are to have OSA.         EPWORTH SLEEPINESS SCALE:  Scale:  (0)= no chance of dozing; (1)= slight chance  of dozing; (2)= moderate chance of dozing; (3)= high chance of dozing  Chance  Situtation    Sitting and reading: 2    Watching TV: 2    Sitting Inactive in public: 0    As a passenger in car: 2      Lying down to rest: 3    Sitting and talking: 1    Sitting quielty after lunch: 1    In a car, stopped in traffic: 0   TOTAL SCORE:   11 out of 24    SLEEP STUDIES:  PSG (06/2023) AHI  69.2/hr, Supine AHI 92.3/hr, REM AHI 89/hr, min SpO2 69% Titration (07/2023) CPAP@ 14 cmH2O   CPAP COMPLIANCE DATA:  Date Range: 10/06/2023-11/04/2023  Average Daily Use: 5 hours 47 minutes  Median Use: 5 hours 39 minutes  Compliance for > 4 Hours: 73%  AHI: 0.8 respiratory events per hour  Days Used: 28/30 days  Mask Leak: 21.4  95th Percentile Pressure: 14         LABS: Recent Results (from the past 2160 hours)  Lipoprotein A (LPA)     Status: Abnormal   Collection Time: 11/10/23  4:07 PM  Result Value Ref Range   Lipoprotein (a) 295 (H) <75 nmol/L    Comment: . Risk Category   Optimal        < 75 nmol/L   Moderate   75 - 125 nmol/L   High          > 125 nmol/L . Cardiovascular event risk category cut points (optimal, moderate, high) are based on Tsimika S. JACC 2017;69:692-711. .   Lipid panel     Status: Abnormal   Collection Time: 11/18/23 10:23 AM  Result Value Ref Range   Cholesterol 150 <200 mg/dL   HDL 49 (L) > OR = 50 mg/dL   Triglycerides 711 (H) <150 mg/dL    Comment: . If a non-fasting specimen was collected, consider repeat triglyceride testing on a fasting specimen if clinically indicated.  Veatrice et al. J. of Clin. Lipidol. 2015;9:129-169. SABRA    LDL Cholesterol (Calc) 63 mg/dL (calc)    Comment: Reference range: <100 . Desirable range <100 mg/dL for primary prevention;   <70 mg/dL for patients with CHD or diabetic patients  with > or = 2 CHD risk factors. SABRA LDL-C is now calculated using the Martin-Hopkins  calculation, which is a  validated novel method providing  better accuracy than the Friedewald equation in the  estimation of LDL-C.  Gladis APPLETHWAITE et al. SANDREA. 7986;689(80): 2061-2068  (http://education.[facility].com/faq/FAQ164)    Total CHOL/HDL Ratio 3.1 <5.0 (calc)   Non-HDL Cholesterol (Calc) 101 <130 mg/dL (calc)    Comment: For patients with diabetes plus 1 major ASCVD risk  factor, treating to a non-HDL-C goal of <100 mg/dL  (LDL-C of <29 mg/dL) is considered a therapeutic  option.   Comprehensive metabolic panel with GFR     Status: Abnormal   Collection Time: 11/18/23 10:23 AM  Result Value Ref Range   Glucose, Bld 137 (H) 65 - 99 mg/dL    Comment: .            Fasting reference interval . For someone without known diabetes, a glucose value >125 mg/dL indicates that they may have diabetes and this should be confirmed with a follow-up test. .    BUN 17 7 - 25 mg/dL   Creat 9.29 9.49 - 8.94 mg/dL   eGFR 98 > OR = 60 fO/fpw/8.26f7   BUN/Creatinine Ratio SEE NOTE: 6 - 22 (calc)    Comment:    Not Reported: BUN and Creatinine are within    reference range. .    Sodium 142 135 - 146 mmol/L   Potassium 4.4 3.5 - 5.3 mmol/L   Chloride 109 98 - 110 mmol/L   CO2 27 20 - 32 mmol/L   Calcium  8.3 (L) 8.6 - 10.4 mg/dL   Total Protein 6.3 6.1 - 8.1 g/dL   Albumin 4.3 3.6 - 5.1 g/dL   Globulin 2.0 1.9 - 3.7 g/dL (calc)   AG Ratio 2.2  1.0 - 2.5 (calc)   Total Bilirubin 0.3 0.2 - 1.2 mg/dL   Alkaline phosphatase (APISO) 79 37 - 153 U/L   AST 14 10 - 35 U/L   ALT 20 6 - 29 U/L  Hemoglobin A1c     Status: Abnormal   Collection Time: 11/18/23 10:23 AM  Result Value Ref Range   Hgb A1c MFr Bld 6.3 (H) <5.7 %    Comment: For someone without known diabetes, a hemoglobin  A1c value between 5.7% and 6.4% is consistent with prediabetes and should be confirmed with a  follow-up test. . For someone with known diabetes, a value <7% indicates that their diabetes is well controlled. A1c targets should  be individualized based on duration of diabetes, age, comorbid conditions, and other considerations. . This assay result is consistent with an increased risk of diabetes. . Currently, no consensus exists regarding use of hemoglobin A1c for diagnosis of diabetes for children. .    Mean Plasma Glucose 134 mg/dL   eAG (mmol/L) 7.4 mmol/L  CBC with Differential/Platelet     Status: Abnormal   Collection Time: 11/18/23 10:23 AM  Result Value Ref Range   WBC 4.8 3.8 - 10.8 Thousand/uL   RBC 3.96 3.80 - 5.10 Million/uL   Hemoglobin 11.6 (L) 11.7 - 15.5 g/dL   HCT 64.7 64.9 - 54.9 %   MCV 88.9 80.0 - 100.0 fL   MCH 29.3 27.0 - 33.0 pg   MCHC 33.0 32.0 - 36.0 g/dL    Comment: For adults, a slight decrease in the calculated MCHC value (in the range of 30 to 32 g/dL) is most likely not clinically significant; however, it should be interpreted with caution in correlation with other red cell parameters and the patient's clinical condition.    RDW 13.2 11.0 - 15.0 %   Platelets 230 140 - 400 Thousand/uL   MPV 10.3 7.5 - 12.5 fL   Neutro Abs 3,202 1,500 - 7,800 cells/uL   Absolute Lymphocytes 1,061 850 - 3,900 cells/uL   Absolute Monocytes 398 200 - 950 cells/uL   Eosinophils Absolute 120 15 - 500 cells/uL   Basophils Absolute 19 0 - 200 cells/uL   Neutrophils Relative % 66.7 %   Total Lymphocyte 22.1 %   Monocytes Relative 8.3 %   Eosinophils Relative 2.5 %   Basophils Relative 0.4 %  Urinalysis, Routine w reflex microscopic     Status: Abnormal   Collection Time: 11/18/23 10:23 AM  Result Value Ref Range   Color, Urine YELLOW YELLOW   APPearance CLEAR CLEAR   Specific Gravity, Urine 1.013 1.001 - 1.035   pH 5.5 5.0 - 8.0   Glucose, UA NEGATIVE NEGATIVE   Bilirubin Urine NEGATIVE NEGATIVE   Ketones, ur NEGATIVE NEGATIVE   Hgb urine dipstick NEGATIVE NEGATIVE   Protein, ur NEGATIVE NEGATIVE   Nitrite NEGATIVE NEGATIVE   Leukocytes,Ua 2+ (A) NEGATIVE   WBC, UA 0-5 0 - 5 /HPF    RBC / HPF NONE SEEN 0 - 2 /HPF   Squamous Epithelial / HPF 0-5 < OR = 5 /HPF   Bacteria, UA NONE SEEN NONE SEEN /HPF   Hyaline Cast NONE SEEN NONE SEEN /LPF  MICROSCOPIC MESSAGE     Status: None   Collection Time: 11/18/23 10:23 AM  Result Value Ref Range   Note      Comment: This urine was analyzed for the presence of WBC,  RBC, bacteria, casts, and other formed elements.  Only those elements seen  were reported. . .   ECHOCARDIOGRAM COMPLETE     Status: None   Collection Time: 12/04/23  3:55 PM  Result Value Ref Range   AV Peak grad 10.1 mmHg   Ao pk vel 1.59 m/s   S' Lateral 2.90 cm   Area-P 1/2 3.48 cm2   AV Mean grad 5.0 mmHg   Est EF 55 - 60%     Radiology: [facility] MEDICARE SPEECH PATH Result Date: 08/12/2023 Table formatting from the original result was not included. Modified Barium Swallow Study Patient Details Name: Gabrielle Williams MRN: [id number] Date of Birth: 1960/11/12 Today's Date: 08/12/2023 HPI/PMH: Pt is a 63 y.o. female with PMH including GERD on PPI (intermittently per pt report), chronic back pain, DM, prior heavy alcohol use and tobacco use per MD note, hyperlipidemia, bilateral lower extremity swelling, obesity per MD note, high risk of sleep disordered breathing due to witnessed apnea and hypersomnia. Last CXR was 08/2022: Negative. No acute cardiopulmonary abnormality.. No report of/dx'd pneumonia. No report of weight loss nor changes in eating habits per pt.  Clinical Impression Patient presents today with functional oropharyngeal phase swallowing w/ No aspiration nor laryngeal penetration noted during this study. 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 ANY Esophageal phase deficits can impact the oropharyngeal phase of swallowing. Pt has REFLUX/GERD and is on a PPI, sometimes when I need it. She described a Globus sensation w/ solids(more) when at Home, pointing to the sternal notch area>mid-sternum area.  Oral phase is  characterized by adequate lip closure, bolus preparation and containment, and anterior to posterior transit. Swallow initiation occurs primarily at the level of the BOT>valleculae w/ all trial consistencies.  Pharyngeal phase is noted for adequate tongue base retraction, adequate hyolaryngeal excursion, and adequate pharyngeal constriction. No laryngeal penetration nor aspriation occurred. Epiglottic inversion is complete w/ all trials during the study w/ timely/tight epiglottic inversion/closure. Pharyngeal stripping wave is complete. No pharyngeal residue remained post swallow w/ all trials.  Amplitude/duration of cricopharyngeus opening appeared [facility]. There was adequate/complete clearance through the upper cervical Esophagus. OF NOTE: a min prominent CP Muscle(posterior wall) was noted DURING the swallow- this did not appear to impact bolus motility through the Cervical Esophagus.  An Esophageal sweep was performed in the standing position revealing min bolus stasis in the ~Thoracic Esophagus- this may also be related to the Esophageal phase Dysmotility pt describes.  Pt was educated on the results of the study immediately after; video viewed and questions answered. Factors that may increase risk of adverse event in presence of aspiration Noe & Lianne 2021): GI disease (GERD on PPI at home) Swallow Evaluation Recommendations Recommendations: PO diet PO Diet Recommendation: Regular; Thin liquids (Level 0) (small-cut foods; moistened foods; monitor for problematic foods and report to GI/PCP) Liquid Administration via: Cup; less straw use d/t air swallowed Medication Administration: Whole meds with liquid ((no issues at Baseline per pt report)) Supervision: Patient able to self-feed Swallowing strategie : Minimize environmental distractions; Slow rate; Small bites/sips; Follow solids with liquids Postural changes: Position pt fully upright for meals; Stay upright 30-60 min after meals (GERD precautions Baseline)  Oral care recommendations: Oral care BID (2x/day); Pt independent with oral care Recommended consults: Consider GI consultation; Consider esophageal assessment Treatment Plan Treatment Plan Treatment recommendations: No treatment recommended at this time Follow-up recommendations: No SLP follow up Recommendations Comment: GI f/u for support, education Treatment frequency: -- (n/a) Treatment duration: -- (n/a) Interventions: Aspiration precaution training; Patient/family education (general Reflux  precautions) Recommendations Recommendations for follow up therapy are one component of a multi-disciplinary discharge planning process, led by the attending physician.  Recommendations may be updated based on patient status, additional functional criteria and insurance authorization. Assessment: Orofacial Exam: Orofacial Exam Oral Cavity: Oral Hygiene: WFL Oral Cavity - Dentition: Dentures, top (partial; missing few lower) Orofacial Anatomy: WFL Oral Motor/Sensory Function: WFL Anatomy: Anatomy: Prominent cricopharyngeus (during the swallow noted) Boluses Administered: Boluses Administered Boluses Administered: Thin liquids (Level 0); Mildly thick liquids (Level 2, nectar thick); Moderately thick liquids (Level 3, honey thick); Puree; Solid  Oral Impairment Domain: Oral Impairment Domain Lip Closure: No labial escape Tongue control during bolus hold: Cohesive bolus between tongue to palatal seal Bolus preparation/mastication: Timely and efficient chewing and mashing Bolus transport/lingual motion: Brisk tongue motion (WFL- dislike of few trials) Oral residue: Complete oral clearance Location of oral residue : N/A Initiation of pharyngeal swallow : Valleculae  Pharyngeal Impairment Domain: Pharyngeal Impairment Domain Soft palate elevation: No bolus between soft palate (SP)/pharyngeal wall (PW) Laryngeal elevation: Complete superior movement of thyroid cartilage with complete approximation of arytenoids to epiglottic petiole  Anterior hyoid excursion: Complete anterior movement Epiglottic movement: Complete inversion Laryngeal vestibule closure: Complete, no air/contrast in laryngeal vestibule Pharyngeal stripping wave : Present - complete Pharyngeal contraction (A/P view only): Complete Pharyngoesophageal segment opening: Complete distension and complete duration, no obstruction of flow Tongue base retraction: No contrast between tongue base and posterior pharyngeal wall (PPW) Pharyngeal residue: Complete pharyngeal clearance Location of pharyngeal residue: N/A  Esophageal Impairment Domain: Esophageal Impairment Domain Esophageal clearance upright position: Complete clearance, esophageal coating Pill: Pill Consistency administered: -- (n/a) Penetration/Aspiration Scale Score: Penetration/Aspiration Scale Score 1.  Material does not enter airway: Thin liquids (Level 0); Mildly thick liquids (Level 2, nectar thick); Moderately thick liquids (Level 3, honey thick); Puree; Solid Compensatory Strategies: Compensatory Strategies Compensatory strategies: No   General Information: Caregiver present: No  Diet Prior to this Study: Regular; Thin liquids (Level 0)   No data recorded  Respiratory Status: WFL   Supplemental O2: None (Room air)   History of Recent Intubation: No  Behavior/Cognition: Alert; Cooperative; Pleasant mood (x4) Self-Feeding Abilities: Able to self-feed Baseline vocal quality/speech: Normal Volitional Cough: Able to elicit Volitional Swallow: Able to elicit Exam Limitations: No limitations Goal Planning: No data recorded No data recorded Barriers/Prognosis Comment: GERD baseline Patient/Family Stated Goal: to know more about why this bothers me Consulted and agree with results and recommendations: Patient Pain: Pain Assessment Pain Assessment: No/denies pain End of Session: Start Time:SLP Start Time (ACUTE ONLY): 1245 Stop Time: SLP Stop Time (ACUTE ONLY): 1400 Time Calculation:SLP Time Calculation (min) (ACUTE ONLY): 75  min Charges: SLP Evaluations $ SLP Speech Visit: 1 Visit SLP Evaluations $MBS Swallow: 1 Procedure SLP visit diagnosis: SLP Visit Diagnosis: Dysphagia, unspecified (R13.10) (potential Esophageal phase Dysmotility; baseline GERD) Past Medical History: Past Medical History: Diagnosis Date  Back pain   Depression   Diabetes mellitus without complication (HCC)   Sickle cell trait (HCC)  Past Surgical History: Past Surgical History: Procedure Laterality Date  CESAREAN SECTION    TUBAL LIGATION   Comer Portugal, MS, CCC-SLP Speech Language Pathologist Rehab Services; [facility] [phone] (ascom) Watson,Katherine 08/12/2023, 5:41 PM CLINICAL DATA:  Provided history: Dysphagia, unspecified type. EXAM: MODIFIED BARIUM SWALLOW TECHNIQUE: Different consistencies of barium were administered orally to the patient by the speech pathologist. Imaging of the pharynx was acquired in the lateral projection. The radiology APP, Laymon Coast, NP (supervised by Dr. Rockey  Richelle) was present in the fluoroscopy room for this study, providing personal supervision. The radiologist was not personally present for this examination. Cine fluoroscopy/video radiography was acquired for the study. FLUOROSCOPY: Radiation Exposure Index (as provided by the fluoroscopic device): 13.20 mGy Kerma COMPARISON:  None. FINDINGS: Different consistencies of barium were administered orally to the patient by the speech pathologist with fluoroscopic imaging of the pharynx acquired in the lateral projection. Limited imaging of the esophagus was also acquired. The speech pathologist did not observe aspiration. Please refer to the speech pathologist's report for full details. IMPRESSION: Modified barium swallow as described. No aspiration observed. Please refer to the speech pathologist's report for complete details and recommendations. Electronically Signed   By: Rockey Richelle D.O.   On: 08/12/2023 14:01   ECHOCARDIOGRAM COMPLETE Result Date:  12/04/2023    ECHOCARDIOGRAM REPORT   Patient Name:   Gabrielle Williams Date of Exam: 12/04/2023 Medical Rec #:  [id number]      Height:       64.0 in Accession #:    [id number]     Weight:       226.2 lb Date of Birth:  08/23/1960      BSA:          2.061 m Patient Age:    63 years       BP:           114/45 mmHg Patient Gender: F              HR:           55 bpm. Exam Location:  [place] Procedure: 2D Echo, Cardiac Doppler, Color Doppler, 3D Echo and Strain Analysis            (Both Spectral and Color Flow Doppler were utilized during            procedure). Indications:    R01.1 Murmur  History:        Patient has no prior history of Echocardiogram examinations.                 Arrythmias:Bradycardia, Signs/Symptoms:Hypotension, Murmur,                 Edema and Shortness of Breath; Risk Factors:Sleep Apnea,                 Dyslipidemia and Former Smoker.  Sonographer:    Gordon Miyamoto Referring Phys: [id number] CLARISSA A ZAFIROV  Sonographer Comments: Patient is obese. Image acquisition challenging due to patient body habitus. IMPRESSIONS  1. Technically difficult study.  2. Left ventricular ejection fraction, by estimation, is 55 to 60%. Left ventricular ejection fraction by 3D volume is 55 %. The left ventricle has normal function. Left ventricular endocardial border not optimally defined to evaluate regional wall motion. Left ventricular diastolic parameters were normal. The average left ventricular global longitudinal strain is -21.6 %. The global longitudinal strain is normal.  3. Right ventricular systolic function is normal. The right ventricular size is normal.  4. The mitral valve was not well visualized. Mild mitral valve regurgitation. No evidence of mitral stenosis.  5. The aortic valve has an indeterminant number of cusps. Aortic valve regurgitation is not visualized. No aortic stenosis is present.  6. The inferior vena cava is normal in size with greater than 50% respiratory variability, suggesting right  atrial pressure of 3 mmHg. Comparison(s): No prior Echocardiogram. Conclusion(s)/Recommendation(s): Normal biventricular function without evidence of hemodynamically significant valvular heart disease. FINDINGS  Left Ventricle:  Left ventricular ejection fraction, by estimation, is 55 to 60%. Left ventricular ejection fraction by 3D volume is 55 %. The left ventricle has normal function. Left ventricular endocardial border not optimally defined to evaluate regional wall motion. The average left ventricular global longitudinal strain is -21.6 %. Strain was performed and the global longitudinal strain is normal. The left ventricular internal cavity size was normal in size. There is no left ventricular hypertrophy. Left ventricular diastolic parameters were normal. Right Ventricle: The right ventricular size is normal. Right vetricular wall thickness was not well visualized. Right ventricular systolic function is normal. Left Atrium: Left atrial size was normal in size. Right Atrium: Right atrial size was normal in size. Pericardium: There is no evidence of pericardial effusion. Mitral Valve: The mitral valve was not well visualized. Mild mitral valve regurgitation. No evidence of mitral valve stenosis. Tricuspid Valve: The tricuspid valve is not well visualized. Tricuspid valve regurgitation is trivial. No evidence of tricuspid stenosis. Aortic Valve: The aortic valve has an indeterminant number of cusps. Aortic valve regurgitation is not visualized. No aortic stenosis is present. Aortic valve mean gradient measures 5.0 mmHg. Aortic valve peak gradient measures 10.1 mmHg. Pulmonic Valve: The pulmonic valve was not well visualized. Pulmonic valve regurgitation is not visualized. No evidence of pulmonic stenosis. Aorta: The aortic root and ascending aorta are structurally normal, with no evidence of dilitation. Venous: The inferior vena cava is normal in size with greater than 50% respiratory variability, suggesting  right atrial pressure of 3 mmHg. IAS/Shunts: The interatrial septum was not well visualized. Additional Comments: 3D was performed not requiring image post processing on an independent workstation and was normal.  LEFT VENTRICLE PLAX 2D LVIDd:         5.20 cm         Diastology LVIDs:         2.90 cm         LV e' medial:    9.46 cm/s LV PW:         1.00 cm         LV E/e' medial:  13.3 LV IVS:        1.00 cm         LV e' lateral:   11.40 cm/s                                LV E/e' lateral: 11.1                                 2D Longitudinal                                Strain                                2D Strain GLS   -21.6 %                                Avg:                                 3D Volume EF  LV 3D EF:    Left                                             ventricul                                             ar                                             ejection                                             fraction                                             by 3D                                             volume is                                             55 %.                                 3D Volume EF:                                3D EF:        55 %                                LV EDV:       131 ml                                LV ESV:       59 ml                                LV SV:        72 ml RIGHT VENTRICLE             IVC RV Basal diam:  3.20 cm     IVC diam: 1.50 cm RV Mid diam:    2.20 cm RV S prime:     12.70 cm/s TAPSE (M-mode): 3.2 cm LEFT ATRIUM             Index        RIGHT ATRIUM  Index LA diam:        4.10 cm 1.99 cm/m   RA Area:     16.80 cm LA Vol (A2C):   45.4 ml 22.02 ml/m  RA Volume:   43.60 ml  21.15 ml/m LA Vol (A4C):   51.6 ml 25.03 ml/m LA Biplane Vol: 52.9 ml 25.66 ml/m  AORTIC VALVE AV Vmax:           159.00 cm/s AV Vmean:          106.000 cm/s AV VTI:            0.378 m AV Peak Grad:      10.1 mmHg AV Mean  Grad:      5.0 mmHg LVOT Vmax:         137.00 cm/s LVOT Vmean:        89.100 cm/s LVOT VTI:          0.328 m LVOT/AV VTI ratio: 0.87  AORTA Ao Asc diam: 2.50 cm MITRAL VALVE MV Area (PHT): 3.48 cm     SHUNTS MV Decel Time: 218 msec     Systemic VTI: 0.33 m MV E velocity: 126.00 cm/s MV A velocity: 87.60 cm/s MV E/A ratio:  1.44 Caron Poser Electronically signed by Caron Poser Signature Date/Time: 12/04/2023/4:21:56 PM    Final     ECHOCARDIOGRAM COMPLETE Result Date: 12/04/2023    ECHOCARDIOGRAM REPORT   Patient Name:   Gabrielle Williams Date of Exam: 12/04/2023 Medical Rec #:  [id number]      Height:       64.0 in Accession #:    [id number]     Weight:       226.2 lb Date of Birth:  1960/06/26      BSA:          2.061 m Patient Age:    63 years       BP:           114/45 mmHg Patient Gender: F              HR:           55 bpm. Exam Location:  [facility] Procedure: 2D Echo, Cardiac Doppler, Color Doppler, 3D Echo and Strain Analysis            (Both Spectral and Color Flow Doppler were utilized during            procedure). Indications:    R01.1 Murmur  History:        Patient has no prior history of Echocardiogram examinations.                 Arrythmias:Bradycardia, Signs/Symptoms:Hypotension, Murmur,                 Edema and Shortness of Breath; Risk Factors:Sleep Apnea,                 Dyslipidemia and Former Smoker.  Sonographer:    Gordon Miyamoto Referring Phys: [id number] CLARISSA A ZAFIROV  Sonographer Comments: Patient is obese. Image acquisition challenging due to patient body habitus. IMPRESSIONS  1. Technically difficult study.  2. Left ventricular ejection fraction, by estimation, is 55 to 60%. Left ventricular ejection fraction by 3D volume is 55 %. The left ventricle has normal function. Left ventricular endocardial border not optimally defined to evaluate regional wall motion. Left ventricular diastolic parameters were normal. The average left ventricular global longitudinal strain is -21.6 %. The  global longitudinal strain is normal.  3. Right ventricular systolic function is normal. The right ventricular size is normal.  4. The mitral valve was not well visualized. Mild mitral valve regurgitation. No evidence of mitral stenosis.  5. The aortic valve has an indeterminant number of cusps. Aortic valve regurgitation is not visualized. No aortic stenosis is present.  6. The inferior vena cava is normal in size with greater than 50% respiratory variability, suggesting right atrial pressure of 3 mmHg. Comparison(s): No prior Echocardiogram. Conclusion(s)/Recommendation(s): Normal biventricular function without evidence of hemodynamically significant valvular heart disease. FINDINGS  Left Ventricle: Left ventricular ejection fraction, by estimation, is 55 to 60%. Left ventricular ejection fraction by 3D volume is 55 %. The left ventricle has normal function. Left ventricular endocardial border not optimally defined to evaluate regional wall motion. The average left ventricular global longitudinal strain is -21.6 %. Strain was performed and the global longitudinal strain is normal. The left ventricular internal cavity size was normal in size. There is no left ventricular hypertrophy. Left ventricular diastolic parameters were normal. Right Ventricle: The right ventricular size is normal. Right vetricular wall thickness was not well visualized. Right ventricular systolic function is normal. Left Atrium: Left atrial size was normal in size. Right Atrium: Right atrial size was normal in size. Pericardium: There is no evidence of pericardial effusion. Mitral Valve: The mitral valve was not well visualized. Mild mitral valve regurgitation. No evidence of mitral valve stenosis. Tricuspid Valve: The tricuspid valve is not well visualized. Tricuspid valve regurgitation is trivial. No evidence of tricuspid stenosis. Aortic Valve: The aortic valve has an indeterminant number of cusps. Aortic valve regurgitation is not  visualized. No aortic stenosis is present. Aortic valve mean gradient measures 5.0 mmHg. Aortic valve peak gradient measures 10.1 mmHg. Pulmonic Valve: The pulmonic valve was not well visualized. Pulmonic valve regurgitation is not visualized. No evidence of pulmonic stenosis. Aorta: The aortic root and ascending aorta are structurally normal, with no evidence of dilitation. Venous: The inferior vena cava is normal in size with greater than 50% respiratory variability, suggesting right atrial pressure of 3 mmHg. IAS/Shunts: The interatrial septum was not well visualized. Additional Comments: 3D was performed not requiring image post processing on an independent workstation and was normal.  LEFT VENTRICLE PLAX 2D LVIDd:         5.20 cm         Diastology LVIDs:         2.90 cm         LV e' medial:    9.46 cm/s LV PW:         1.00 cm         LV E/e' medial:  13.3 LV IVS:        1.00 cm         LV e' lateral:   11.40 cm/s                                LV E/e' lateral: 11.1                                 2D Longitudinal                                Strain  2D Strain GLS   -21.6 %                                Avg:                                 3D Volume EF                                LV 3D EF:    Left                                             ventricul                                             ar                                             ejection                                             fraction                                             by 3D                                             volume is                                             55 %.                                 3D Volume EF:                                3D EF:        55 %                                LV EDV:       131 ml                                LV ESV:       59 ml  LV SV:        72 ml RIGHT VENTRICLE             IVC RV Basal diam:  3.20 cm     IVC diam: 1.50 cm RV  Mid diam:    2.20 cm RV S prime:     12.70 cm/s TAPSE (M-mode): 3.2 cm LEFT ATRIUM             Index        RIGHT ATRIUM           Index LA diam:        4.10 cm 1.99 cm/m   RA Area:     16.80 cm LA Vol (A2C):   45.4 ml 22.02 ml/m  RA Volume:   43.60 ml  21.15 ml/m LA Vol (A4C):   51.6 ml 25.03 ml/m LA Biplane Vol: 52.9 ml 25.66 ml/m  AORTIC VALVE AV Vmax:           159.00 cm/s AV Vmean:          106.000 cm/s AV VTI:            0.378 m AV Peak Grad:      10.1 mmHg AV Mean Grad:      5.0 mmHg LVOT Vmax:         137.00 cm/s LVOT Vmean:        89.100 cm/s LVOT VTI:          0.328 m LVOT/AV VTI ratio: 0.87  AORTA Ao Asc diam: 2.50 cm MITRAL VALVE MV Area (PHT): 3.48 cm     SHUNTS MV Decel Time: 218 msec     Systemic VTI: 0.33 m MV E velocity: 126.00 cm/s MV A velocity: 87.60 cm/s MV E/A ratio:  1.44 Caron Poser Electronically signed by Caron Poser Signature Date/Time: 12/04/2023/4:21:56 PM    Final       Assessment and Plan: Patient Active Problem List   Diagnosis Date Noted   CPAP use counseling 12/07/2023   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 OSA (obstructive sleep apnea) (Primary) The patient does tolerate PAP and reports  benefit from PAP use. The patient was reminded how to clean equipment and advised to replace supplies routinely. The patient was also counselled on weight loss. The compliance is fair. The AHI is 0.8.   OSA on cpap- controlled. Continue with excellent compliance with pap. CPAP continues to be medically necessary to treat this patient's OSA. F/u one year.    2. CPAP use  counseling CPAP Counseling: had a lengthy discussion with the patient regarding the importance of PAP therapy in management of the sleep apnea. Patient appears to understand the risk factor reduction and also understands the risks associated with untreated sleep apnea. Patient will try to make a good faith effort to remain compliant with therapy. Also instructed the patient on proper cleaning of the device including the water must be changed daily if possible and use of distilled water is preferred. Patient understands that the machine should be regularly cleaned with appropriate recommended cleaning solutions that do not damage the PAP  machine for example given white vinegar and water rinses. Other methods such as ozone treatment may not be as good as these simple methods to achieve cleaning.      General Counseling: I have discussed the findings of the evaluation and examination with Gabrielle Williams.  I have also discussed any further diagnostic evaluation thatmay be needed or ordered today. Evalise verbalizes understanding of the findings of todays visit. We also reviewed her medications today and discussed drug interactions and side effects including but not limited excessive drowsiness and altered mental states. We also discussed that there is always a risk not just to her but also people around her. she has been encouraged to call the office with any questions or concerns that should arise related to todays visit.  No orders of the defined types were placed in this encounter.       I have personally obtained a history, examined the patient, evaluated laboratory and imaging results, formulated the assessment and plan and placed orders. This patient was seen today by Lauraine Lay, PA-C in collaboration with Dr. Elfreda Bathe.   Elfreda DELENA Bathe, MD [facility] Diplomate ABMS Pulmonary Critical Care Medicine and Sleep Medicine

## 2023-12-07 ENCOUNTER — Ambulatory Visit: Payer: Self-pay

## 2023-12-07 ENCOUNTER — Ambulatory Visit (INDEPENDENT_AMBULATORY_CARE_PROVIDER_SITE_OTHER): Admitting: Internal Medicine

## 2023-12-07 VITALS — BP 150/82 | HR 61 | Resp 16 | Ht 64.0 in | Wt 225.0 lb

## 2023-12-07 DIAGNOSIS — Z7189 Other specified counseling: Secondary | ICD-10-CM | POA: Insufficient documentation

## 2023-12-07 DIAGNOSIS — G4733 Obstructive sleep apnea (adult) (pediatric): Secondary | ICD-10-CM

## 2023-12-07 NOTE — Patient Instructions (Signed)

## 2023-12-14 ENCOUNTER — Telehealth: Payer: Self-pay

## 2023-12-14 ENCOUNTER — Other Ambulatory Visit: Payer: Self-pay

## 2023-12-14 DIAGNOSIS — K219 Gastro-esophageal reflux disease without esophagitis: Secondary | ICD-10-CM

## 2023-12-14 MED ORDER — PANTOPRAZOLE SODIUM 40 MG PO TBEC: 40.0000 mg | DELAYED_RELEASE_TABLET | Freq: Every day | ORAL | 0 refills | Status: AC

## 2023-12-14 MED ORDER — ZOLPIDEM TARTRATE 5 MG PO TABS: 5.0000 mg | ORAL_TABLET | Freq: Every evening | ORAL | 0 refills | Status: DC | PRN

## 2023-12-14 NOTE — Telephone Encounter (Signed)
 Received a Rx refill request for Ambien this morning from [facility]. This is a historical med. Is this something you are going to refill for patient? Thanks!

## 2023-12-18 ENCOUNTER — Ambulatory Visit

## 2024-01-10 ENCOUNTER — Other Ambulatory Visit: Payer: Self-pay

## 2024-01-13 ENCOUNTER — Other Ambulatory Visit: Payer: Self-pay

## 2024-01-13 NOTE — Telephone Encounter (Signed)
 Requested medication (s) are due for refill today: Yes  Requested medication (s) are on the active medication list: Yes  Last refill:  12/14/23  Future visit scheduled: Yes  Notes to clinic:  Unable to refill per protocol, cannot delegate.      Requested Prescriptions  Pending Prescriptions Disp Refills   zolpidem  (AMBIEN ) 5 MG tablet 90 tablet 0    Sig: Take 1 tablet (5 mg total) by mouth at bedtime as needed. Take 2 at bedtime.     Not Delegated - Psychiatry:  Anxiolytics/Hypnotics Failed - 01/13/2024  5:28 PM      Failed - This refill cannot be delegated      Failed - Urine Drug Screen completed in last 360 days      Passed - Valid encounter within last 6 months    Recent Outpatient Visits           1 month ago Elevated lipoprotein(a)   [facility], Parris LABOR, MD   2 months ago Flu vaccine need   [facility] Everlene Parris LABOR, MD

## 2024-01-13 NOTE — Telephone Encounter (Unsigned)
 Copied from CRM [id number]. Topic: Clinical - Medication Refill >> Jan 13, 2024  2:19 PM Delon T wrote: Medication: zolpidem  (AMBIEN ) 5 MG tablet- asking for the pinkish tablets if they are available  Has the patient contacted their pharmacy? No (Agent: If no, request that the patient contact the pharmacy for the refill. If patient does not wish to contact the pharmacy document the reason why and proceed with request.) (Agent: If yes, when and what did the pharmacy advise?)  This is the patient's preferred pharmacy:  [facility]  [address] [place], [place], [place] [address]    [phone]  Is this the correct pharmacy for this prescription? Yes If no, delete pharmacy and type the correct one.   Has the prescription been filled recently? Yes  Is the patient out of the medication? Yes  Has the patient been seen for an appointment in the last year OR does the patient have an upcoming appointment? Yes  Can we respond through MyChart? No  Agent: Please be advised that Rx refills may take up to 3 business days. We ask that you follow-up with your pharmacy.

## 2024-01-15 MED ORDER — ZOLPIDEM TARTRATE 5 MG PO TABS: 5.0000 mg | ORAL_TABLET | Freq: Every evening | ORAL | 0 refills | Status: DC | PRN

## 2024-01-19 ENCOUNTER — Telehealth: Payer: Self-pay

## 2024-01-19 ENCOUNTER — Other Ambulatory Visit: Payer: Self-pay

## 2024-01-19 MED ORDER — ZOLPIDEM TARTRATE 5 MG PO TABS: 5.0000 mg | ORAL_TABLET | Freq: Every evening | ORAL | 0 refills | Status: DC | PRN

## 2024-01-19 NOTE — Telephone Encounter (Signed)
 Copied from CRM [id number]. Topic: Clinical - Medication Refill >> Jan 13, 2024  2:19 PM Delon T wrote: Medication: zolpidem  (AMBIEN ) 5 MG tablet- asking for the pinkish tablets if they are available  Has the patient contacted their pharmacy? No (Agent: If no, request that the patient contact the pharmacy for the refill. If patient does not wish to contact the pharmacy document the reason why and proceed with request.) (Agent: If yes, when and what did the pharmacy advise?)  This is the patient's preferred pharmacy:  [facility]  [address] [place], [place], [place] [address]    [phone]  Is this the correct pharmacy for this prescription? Yes If no, delete pharmacy and type the correct one.   Has the prescription been filled recently? Yes  Is the patient out of the medication? Yes  Has the patient been seen for an appointment in the last year OR does the patient have an upcoming appointment? Yes  Can we respond through MyChart? No  Agent: Please be advised that Rx refills may take up to 3 business days. We ask that you follow-up with your pharmacy.

## 2024-01-20 ENCOUNTER — Telehealth: Payer: Self-pay

## 2024-01-20 NOTE — Telephone Encounter (Signed)
 Copied from CRM [id number]. Topic: Clinical - Medication Refill >> Jan 13, 2024  2:19 PM Delon T wrote: Medication: zolpidem  (AMBIEN ) 5 MG tablet- asking for the pinkish tablets if they are available  Has the patient contacted their pharmacy? No (Agent: If no, request that the patient contact the pharmacy for the refill. If patient does not wish to contact the pharmacy document the reason why and proceed with request.) (Agent: If yes, when and what did the pharmacy advise?)  This is the patient's preferred pharmacy:  [facility]  [address] [place], [name], [place] [address]    [phone]  Is this the correct pharmacy for this prescription? Yes If no, delete pharmacy and type the correct one.   Has the prescription been filled recently? Yes  Is the patient out of the medication? Yes  Has the patient been seen for an appointment in the last year OR does the patient have an upcoming appointment? Yes  Can we respond through MyChart? No  Agent: Please be advised that Rx refills may take up to 3 business days. We ask that you follow-up with your pharmacy. >> Jan 20, 2024  3:15 PM [name] wrote: Patient calling checking status of refill >> Jan 19, 2024 10:45 AM [name] wrote: Patient calling.. checking status of refill.

## 2024-01-21 ENCOUNTER — Other Ambulatory Visit: Payer: Self-pay

## 2024-01-21 ENCOUNTER — Telehealth: Payer: Self-pay

## 2024-01-21 DIAGNOSIS — K219 Gastro-esophageal reflux disease without esophagitis: Secondary | ICD-10-CM

## 2024-01-21 DIAGNOSIS — R609 Edema, unspecified: Secondary | ICD-10-CM

## 2024-01-21 MED ORDER — ZOLPIDEM TARTRATE 5 MG PO TABS: 5.0000 mg | ORAL_TABLET | Freq: Every evening | ORAL | 0 refills | Status: AC | PRN

## 2024-01-21 NOTE — Telephone Encounter (Signed)
 Prescription Request  01/21/2024  LOV: 11/18/2023  What is the name of the medication or equipment?   zolpidem  (AMBIEN ) 5 MG tablet    Medicine need to  go  to  [facility] #[id number] [place], [place] - [address] AT [facility] OF [address] [place] [address] Phone: [phone] Fax: [phone]  [facility] Delivery - [place], [place] - [address] [place] [address] Phone: [phone] Fax: [phone]  [facility]ORE #[id number] [place], [place] - [address] AT [facility] OF [address] [facility] [address] [place] [address] Phone: [phone] Fax: [phone]  Have you contacted your pharmacy to request a refill? Yes   Which pharmacy would you like this sent to?  [facility] #[id number] [place], [place] - [address] AT [facility] OF [address] [place] [address] Phone: [phone] Fax: [phone]  [facility] Delivery - [place], [place] - [address] [place] [address] Phone: [phone] Fax: [phone]  [facility] STORE #[id number] [place], [place] - [place] [address] AT [facility] OF [address] [facility] [address] [place] [address] Phone: [phone] Fax: [phone]    Patient notified that their request is being sent to the clinical staff for review and that they should receive a response within 2 business days.   Please advise at Mobile [phone] (mobile)

## 2024-01-22 ENCOUNTER — Telehealth: Payer: Self-pay

## 2024-01-22 NOTE — Telephone Encounter (Signed)
 Copied from CRM #[id number]. Topic: Clinical - Medication Question >> Jan 22, 2024  4:27 PM Delon HERO wrote: Reason for CRM: Patient is calling to report that she picked up zolpidem  (AMBIEN ) 5 MG tablet [[id number]] today. Sig states to take one tablet at bedtime. Patient reporting that she takes 2 pills. And received 90 day supply Patient asking what she to do? Please advise

## 2024-01-29 ENCOUNTER — Encounter: Payer: Self-pay | Admitting: *Deleted

## 2024-02-09 ENCOUNTER — Other Ambulatory Visit: Payer: Self-pay

## 2024-02-09 DIAGNOSIS — K219 Gastro-esophageal reflux disease without esophagitis: Secondary | ICD-10-CM

## 2024-02-09 DIAGNOSIS — R609 Edema, unspecified: Secondary | ICD-10-CM

## 2024-02-10 ENCOUNTER — Ambulatory Visit

## 2024-03-16 ENCOUNTER — Ambulatory Visit
